# Patient Record
Sex: Male | Born: 1960 | Race: White | Hispanic: No | Smoking: Current every day smoker
Health system: Southern US, Community
[De-identification: ages and names within clinical notes are randomized; demographics above are authoritative.]

## PROBLEM LIST (undated history)

## (undated) DIAGNOSIS — L409 Psoriasis, unspecified: Secondary | ICD-10-CM

## (undated) DIAGNOSIS — M7542 Impingement syndrome of left shoulder: Secondary | ICD-10-CM

## (undated) DIAGNOSIS — F32A Depression, unspecified: Secondary | ICD-10-CM

## (undated) DIAGNOSIS — R109 Unspecified abdominal pain: Secondary | ICD-10-CM

## (undated) DIAGNOSIS — K635 Polyp of colon: Secondary | ICD-10-CM

## (undated) DIAGNOSIS — R079 Chest pain, unspecified: Secondary | ICD-10-CM

## (undated) DIAGNOSIS — M1990 Unspecified osteoarthritis, unspecified site: Secondary | ICD-10-CM

## (undated) DIAGNOSIS — K429 Umbilical hernia without obstruction or gangrene: Secondary | ICD-10-CM

## (undated) DIAGNOSIS — F419 Anxiety disorder, unspecified: Secondary | ICD-10-CM

## (undated) DIAGNOSIS — F329 Major depressive disorder, single episode, unspecified: Secondary | ICD-10-CM

## (undated) HISTORY — PX: KNEE SURGERY: SHX244

## (undated) HISTORY — DX: Polyp of colon: K63.5

## (undated) HISTORY — DX: Chest pain, unspecified: R07.9

## (undated) HISTORY — PX: CHOLECYSTECTOMY: SHX55

## (undated) HISTORY — DX: Unspecified abdominal pain: R10.9

## (undated) HISTORY — PX: ORTHOPEDIC SURGERY: SHX850

## (undated) HISTORY — DX: Psoriasis, unspecified: L40.9

## (undated) HISTORY — DX: Umbilical hernia without obstruction or gangrene: K42.9

## (undated) HISTORY — PX: OPEN ANTERIOR SHOULDER RECONSTRUCTION: SHX2100

---
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
## 1999-02-18 ENCOUNTER — Encounter: Payer: Self-pay | Admitting: Emergency Medicine

## 1999-02-18 ENCOUNTER — Emergency Department (HOSPITAL_COMMUNITY): Admission: EM | Admit: 1999-02-18 | Discharge: 1999-02-18 | Payer: Self-pay | Admitting: Emergency Medicine

## 1999-03-22 ENCOUNTER — Emergency Department (HOSPITAL_COMMUNITY): Admission: EM | Admit: 1999-03-22 | Discharge: 1999-03-22 | Payer: Self-pay | Admitting: Emergency Medicine

## 1999-06-30 ENCOUNTER — Emergency Department (HOSPITAL_COMMUNITY): Admission: EM | Admit: 1999-06-30 | Discharge: 1999-06-30 | Payer: Self-pay | Admitting: Emergency Medicine

## 2000-02-18 ENCOUNTER — Encounter: Payer: Self-pay | Admitting: Emergency Medicine

## 2000-02-18 ENCOUNTER — Emergency Department (HOSPITAL_COMMUNITY): Admission: EM | Admit: 2000-02-18 | Discharge: 2000-02-18 | Payer: Self-pay | Admitting: Emergency Medicine

## 2000-06-19 ENCOUNTER — Emergency Department (HOSPITAL_COMMUNITY): Admission: EM | Admit: 2000-06-19 | Discharge: 2000-06-19 | Payer: Self-pay | Admitting: Emergency Medicine

## 2001-07-14 ENCOUNTER — Emergency Department (HOSPITAL_COMMUNITY): Admission: EM | Admit: 2001-07-14 | Discharge: 2001-07-14 | Payer: Self-pay

## 2001-12-31 ENCOUNTER — Emergency Department (HOSPITAL_COMMUNITY): Admission: EM | Admit: 2001-12-31 | Discharge: 2001-12-31 | Payer: Self-pay | Admitting: Emergency Medicine

## 2002-02-02 ENCOUNTER — Emergency Department (HOSPITAL_COMMUNITY): Admission: EM | Admit: 2002-02-02 | Discharge: 2002-02-02 | Payer: Self-pay | Admitting: Emergency Medicine

## 2002-02-02 ENCOUNTER — Encounter: Payer: Self-pay | Admitting: Emergency Medicine

## 2003-09-16 ENCOUNTER — Emergency Department (HOSPITAL_COMMUNITY): Admission: EM | Admit: 2003-09-16 | Discharge: 2003-09-16 | Payer: Self-pay | Admitting: Emergency Medicine

## 2003-11-19 ENCOUNTER — Emergency Department (HOSPITAL_COMMUNITY): Admission: EM | Admit: 2003-11-19 | Discharge: 2003-11-19 | Payer: Self-pay

## 2005-04-20 ENCOUNTER — Emergency Department (HOSPITAL_COMMUNITY): Admission: EM | Admit: 2005-04-20 | Discharge: 2005-04-20 | Payer: Self-pay | Admitting: Emergency Medicine

## 2005-08-07 ENCOUNTER — Emergency Department (HOSPITAL_COMMUNITY): Admission: EM | Admit: 2005-08-07 | Discharge: 2005-08-07 | Payer: Self-pay | Admitting: Emergency Medicine

## 2005-09-04 ENCOUNTER — Emergency Department (HOSPITAL_COMMUNITY): Admission: EM | Admit: 2005-09-04 | Discharge: 2005-09-04 | Payer: Self-pay | Admitting: *Deleted

## 2005-09-09 ENCOUNTER — Ambulatory Visit (HOSPITAL_COMMUNITY): Admission: RE | Admit: 2005-09-09 | Discharge: 2005-09-09 | Payer: Self-pay | Admitting: Specialist

## 2006-12-26 ENCOUNTER — Emergency Department (HOSPITAL_COMMUNITY): Admission: EM | Admit: 2006-12-26 | Discharge: 2006-12-26 | Payer: Self-pay | Admitting: *Deleted

## 2007-12-14 ENCOUNTER — Emergency Department (HOSPITAL_COMMUNITY): Admission: EM | Admit: 2007-12-14 | Discharge: 2007-12-14 | Payer: Self-pay | Admitting: Emergency Medicine

## 2008-01-30 ENCOUNTER — Encounter (INDEPENDENT_AMBULATORY_CARE_PROVIDER_SITE_OTHER): Payer: Self-pay | Admitting: General Surgery

## 2008-01-30 ENCOUNTER — Ambulatory Visit (HOSPITAL_COMMUNITY): Admission: RE | Admit: 2008-01-30 | Discharge: 2008-01-30 | Payer: Self-pay | Admitting: General Surgery

## 2008-05-01 ENCOUNTER — Emergency Department (HOSPITAL_COMMUNITY): Admission: EM | Admit: 2008-05-01 | Discharge: 2008-05-01 | Payer: Self-pay | Admitting: Emergency Medicine

## 2008-05-30 ENCOUNTER — Emergency Department (HOSPITAL_COMMUNITY): Admission: EM | Admit: 2008-05-30 | Discharge: 2008-05-30 | Payer: Self-pay | Admitting: Emergency Medicine

## 2008-10-06 ENCOUNTER — Emergency Department (HOSPITAL_COMMUNITY): Admission: EM | Admit: 2008-10-06 | Discharge: 2008-10-06 | Payer: Self-pay | Admitting: Emergency Medicine

## 2009-09-11 ENCOUNTER — Encounter: Admission: RE | Admit: 2009-09-11 | Discharge: 2009-09-11 | Payer: Self-pay | Admitting: Cardiology

## 2009-11-30 ENCOUNTER — Ambulatory Visit (HOSPITAL_BASED_OUTPATIENT_CLINIC_OR_DEPARTMENT_OTHER): Admission: RE | Admit: 2009-11-30 | Discharge: 2009-11-30 | Payer: Self-pay | Admitting: Specialist

## 2010-01-21 ENCOUNTER — Emergency Department (HOSPITAL_COMMUNITY): Admission: EM | Admit: 2010-01-21 | Discharge: 2010-01-21 | Payer: Self-pay | Admitting: Emergency Medicine

## 2010-01-26 ENCOUNTER — Emergency Department (HOSPITAL_COMMUNITY): Admission: EM | Admit: 2010-01-26 | Discharge: 2010-01-26 | Payer: Self-pay | Admitting: Emergency Medicine

## 2010-07-02 LAB — POCT HEMOGLOBIN-HEMACUE: Hemoglobin: 16.7 g/dL (ref 13.0–17.0)

## 2010-08-31 NOTE — Op Note (Signed)
NAME:  Tyler Clark, Tyler Clark                ACCOUNT NO.:  [id number]   MEDICAL RECORD NO.:  [id number]          PATIENT TYPE:  AMB   LOCATION:  DAY                          FACILITY:  [facility]   PHYSICIAN:  Almond Lint, MD       DATE OF BIRTH:  07/19/1960   DATE OF PROCEDURE:  01/30/2008  DATE OF DISCHARGE:                               OPERATIVE REPORT   PREOPERATIVE DIAGNOSES:  Symptomatic cholelithiasis, umbilical hernia.   POSTOPERATIVE DIAGNOSES:  Symptomatic cholelithiasis, umbilical hernia.   PROCEDURES PERFORMED:  Laparoscopic cholecystectomy with intraoperative  cholangiogram and umbilical hernia repair.   SURGEON:  Almond Lint, M.D.   ASSISTANT:  Ollen Gross. Vernell Morgans, M.D.   ANESTHESIA:  General and local.   FINDINGS:  Small gallbladder, full of stones.  Cholangiogram  demonstrating no filling defect.  Good filling of the right and left  hepatic ducts and positive contrast all the way to the duodenum.   SPECIMENS:  Gallbladder to pathology.   EBL:  Minimal.   COMPLICATIONS:  None known.   DESCRIPTION OF PROCEDURE:  Mr. Ritson was identified in the holding area  and taken to the operating room, where he was placed supine on the  operating room table.  General endotracheal anesthesia was induced.  Time-out was performed, according to the surgical safety check-list.  The abdomen was prepped and draped in sterile fashion.  The  supraumbilical skin was infiltrated with local anesthesia and a vertical  incision was made, just above the umbilicus.  The umbilical stalk was  grasped with a Kocher and elevated.  The hernia defect was seen and the  Kocher placed on either side of the fascia and a Kelly clamp used to  enter the peritoneum.  The hernia defect was very tiny, on the order of  8-10 mm.  A purse-string Vicryl suture was placed on the fascial  incision and the Hasson trocar advanced into the abdomen.  The  pneumoperitoneum was achieved to a pressure of 15 mmHg.  The camera  was  advanced into the abdomen.  The patient was placed in reverse  Trendelenburg position, rotated to the left.  A 10 mm port was placed in  the epigastric region under direct visualization after infiltration of  local anesthesia.  Two 5 mm ports were placed in the right upper  quadrant in similar fashion.   The [place] dissector was used to strip the peritoneum from the  infundibulum of the gallbladder to obtain a view of the cystic duct.  This was skeletonized and seen to be directly entering the gallbladder.  An arterial structure was right next to it.  This also was skeletonized  and seen to be directly entering the gallbladder.  A clip was placed on  the gallbladder aspect of the cystic duct and a ductotomy made with the  Endoshears.  A cholangiogram catheter was advanced through the abdominal  wall and the catheter placed through the ductotomy into the cystic duct.  Cholangiogram was shot after placing the patient flat and releasing the  pneumoperitoneum.  This revealed the findings above.  The clip  was then  removed following re-insufflation of the abdomen and the cholangiogram  catheter removed.  The artery was clipped twice on the patient side,  once on the specimen side and the cystic duct was clipped three times on  the patient side.  The Endoshears were used to transect the cystic duct  and the artery.  The hook cautery was then used to take the gallbladder  off the gallbladder fossa.  The gallbladder was placed into the  EndoCatch bag and removed through the umbilicus without difficulty.  The  abdomen was re-insufflated and the liver bed inspected for hemostasis.  There were a few spots of very slow oozing, which were coagulated with  the Bovie electrocautery.  The liver bed was then irrigated, as well as  the area over the liver and this area returned clear.  The area was re-  inspected and there was no bleeding seen.   The right upper quadrant and epigastric ports were  removed under direct  visualization with no bleeding seen.  The Hasson trocar was then removed  and the hernia defect closed with the 0 vicryl purse-string suture.  The  defect was again palpated and there was good closure.  The skin was  closed with 4-0 Monocryl and then cleaned, dried and dressed with  Dermabond.  The patient was awakened from anesthesia and taken to the  PACU in good condition.  Needle and sponge counts were correct times  two.      Almond Lint, MD  Electronically Signed     FB/MEDQ  D:  01/30/2008  T:  01/30/2008  Job:  [id number]

## 2011-01-18 LAB — HEMOGLOBIN AND HEMATOCRIT, BLOOD
HCT: 52.9 — ABNORMAL HIGH
Hemoglobin: 17.8 — ABNORMAL HIGH

## 2011-09-02 ENCOUNTER — Encounter (HOSPITAL_COMMUNITY): Payer: Self-pay | Admitting: Emergency Medicine

## 2011-09-02 ENCOUNTER — Emergency Department (HOSPITAL_COMMUNITY): Payer: BC Managed Care – PPO

## 2011-09-02 ENCOUNTER — Emergency Department (HOSPITAL_COMMUNITY)
Admission: EM | Admit: 2011-09-02 | Discharge: 2011-09-02 | Disposition: A | Discharge status: Home / Self Care | Payer: BC Managed Care – PPO | Attending: Emergency Medicine | Admitting: Emergency Medicine

## 2011-09-02 DIAGNOSIS — R609 Edema, unspecified: Secondary | ICD-10-CM | POA: Insufficient documentation

## 2011-09-02 DIAGNOSIS — S61509A Unspecified open wound of unspecified wrist, initial encounter: Secondary | ICD-10-CM | POA: Insufficient documentation

## 2011-09-02 DIAGNOSIS — S60811A Abrasion of right wrist, initial encounter: Secondary | ICD-10-CM

## 2011-09-02 DIAGNOSIS — M25439 Effusion, unspecified wrist: Secondary | ICD-10-CM | POA: Insufficient documentation

## 2011-09-02 DIAGNOSIS — S60211A Contusion of right wrist, initial encounter: Secondary | ICD-10-CM

## 2011-09-02 DIAGNOSIS — IMO0002 Reserved for concepts with insufficient information to code with codable children: Secondary | ICD-10-CM | POA: Insufficient documentation

## 2011-09-02 DIAGNOSIS — M25539 Pain in unspecified wrist: Secondary | ICD-10-CM | POA: Insufficient documentation

## 2011-09-02 DIAGNOSIS — S60219A Contusion of unspecified wrist, initial encounter: Secondary | ICD-10-CM | POA: Insufficient documentation

## 2011-09-02 MED ORDER — IBUPROFEN 600 MG PO TABS: 600.0000 mg | ORAL_TABLET | Freq: Four times a day (QID) | ORAL | Status: AC | PRN

## 2011-09-02 MED ORDER — TETANUS-DIPHTH-ACELL PERTUSSIS 5-2.5-18.5 LF-MCG/0.5 IM SUSP
0.5000 mL | Freq: Once | INTRAMUSCULAR | Status: AC
  Administered 2011-09-02: 0.5 mL via INTRAMUSCULAR
  Filled 2011-09-02: qty 0.5

## 2011-09-02 NOTE — Discharge Instructions (Signed)
Your x-ray is negative. Keep cut clean. Apply topical antibiotic ointment twice a day. Ice wrist several times a day. Apply splint for comfort. Follow up with your doctor in 2-3 days if not improving.   Contusion A contusion is a deep bruise. Contusions are the result of an injury that caused bleeding under the skin. The contusion may turn blue, purple, or yellow. Minor injuries will give you a painless contusion, but more severe contusions may stay painful and swollen for a few weeks.  CAUSES  A contusion is usually caused by a blow, trauma, or direct force to an area of the body. SYMPTOMS   Swelling and redness of the injured area.   Bruising of the injured area.   Tenderness and soreness of the injured area.   Pain.  DIAGNOSIS  The diagnosis can be made by taking a history and physical exam. An X-ray, CT scan, or MRI may be needed to determine if there were any associated injuries, such as fractures. TREATMENT  Specific treatment will depend on what area of the body was injured. In general, the best treatment for a contusion is resting, icing, elevating, and applying cold compresses to the injured area. Over-the-counter medicines may also be recommended for pain control. Ask your caregiver what the best treatment is for your contusion. HOME CARE INSTRUCTIONS   Put ice on the injured area.   Put ice in a plastic bag.   Place a towel between your skin and the bag.   Leave the ice on for 15 to 20 minutes, 3 to 4 times a day.   Only take over-the-counter or prescription medicines for pain, discomfort, or fever as directed by your caregiver. Your caregiver may recommend avoiding anti-inflammatory medicines (aspirin, ibuprofen, and naproxen) for 48 hours because these medicines may increase bruising.   Rest the injured area.   If possible, elevate the injured area to reduce swelling.  SEEK IMMEDIATE MEDICAL CARE IF:   You have increased bruising or swelling.   You have pain that is  getting worse.   Your swelling or pain is not relieved with medicines.  MAKE SURE YOU:   Understand these instructions.   Will watch your condition.   Will get help right away if you are not doing well or get worse.  Document Released: 01/12/2005 Document Revised: 03/24/2011 Document Reviewed: 02/07/2011 [facility] Patient Information 2012 [place], [place].

## 2011-09-02 NOTE — ED Provider Notes (Signed)
History     CSN: [id number]  Arrival date & time 09/02/11  1436   First MD Initiated Contact with Patient 09/02/11 1550      Chief Complaint  Patient presents with  . Extremity Laceration    (Consider location/radiation/quality/duration/timing/severity/associated sxs/prior treatment) Patient is a 51 y.o. male presenting with wrist pain. The history is provided by the patient.  Wrist Pain This is a new problem. The current episode started today. Associated symptoms include joint swelling. Pertinent negatives include no chills, fever, numbness or weakness.  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 States the cord that you pull on snapped off and hit him in right wrist. States pain and laceration to right wrist. Pain with movement of the wrist, swelling. No other injuries. Bleeding stopped. Tetanus not up to date.   No past medical history on file.  No past surgical history on file.  No family history on file.  History  Substance Use Topics  . Smoking status: Current Everyday Smoker -- 1.0 packs/day  . Smokeless tobacco: Not on file  . Alcohol Use: Yes     Beer      Review of Systems  Constitutional: Negative for fever and chills.  Respiratory: Negative.   Cardiovascular: Negative.   Musculoskeletal: Positive for joint swelling.  Skin: Positive for wound.  Neurological: Negative for weakness and numbness.    Allergies  Review of patient's allergies indicates no known allergies.  Home Medications  No current outpatient prescriptions on file.  BP 115/74  Pulse 73  Temp(Src) 98.8 F (37.1 C) (Oral)  Resp 20  SpO2 100%  Physical Exam  Nursing note and vitals reviewed. Constitutional: He is oriented to person, place, and time. He appears well-developed and well-nourished. No distress.  HENT:  Head: Normocephalic.  Eyes: Conjunctivae are normal.  Pulmonary/Chest: Effort normal and breath sounds normal. No respiratory distress. He has no wheezes. He has no rales.   Musculoskeletal: He exhibits edema and tenderness.       Swelling over radial aspect of right wrist. Pain with flexion, extension of the wrist. Tender over distal radius. There is a triangular abrasion to that area. Hemostatic.   Neurological: He is alert and oriented to person, place, and time.  Skin: Skin is warm and dry.  Psychiatric: He has a normal mood and affect.    ED Course  Procedures (including critical care time)  Pt with right wrist pain and swelling, as well as a small abrasion. Abrasion cleaned with saline and safe clense, bacitracin and band aid applied. No suturing required. Will get x-ray.  Dg Wrist Complete Right  09/02/2011  *RADIOLOGY REPORT*  Clinical Data: Laceration at wrist, pain  RIGHT WRIST - COMPLETE 3+ VIEW  Comparison: Right hand radiographs 08/07/2005  Findings: Bone mineralization normal. Joint spaces preserved. No fracture, dislocation, or bone destruction. No definite radiopaque foreign body or soft tissue gas identified. Soft tissue swelling at radial aspect of wrist.  IMPRESSION: No acute osseous findings.  Original Report Authenticated By: Lollie Marrow, M.D.   5:05 PM X-ray negative. Pt has significant pain with movement of the wrist. No snuff box tenderness. Will splint with a velcro splint and follow up.    1. Contusion of right wrist   2. Abrasion of right wrist       MDM          Lottie Mussel, PA 09/03/11 [phone]

## 2011-09-02 NOTE — ED Notes (Signed)
Working with lawnmower today. It jumped back and struck pt in right wrist. V- shaped laceration noted. Bleeding controlled

## 2011-09-05 NOTE — ED Provider Notes (Signed)
Medical screening examination/treatment/procedure(s) were performed by non-physician practitioner and as supervising physician I was immediately available for consultation/collaboration.   Suzi Roots, MD 09/05/11 [phone]

## 2011-10-13 ENCOUNTER — Emergency Department (HOSPITAL_COMMUNITY)
Admission: EM | Admit: 2011-10-13 | Discharge: 2011-10-13 | Disposition: A | Discharge status: Home / Self Care | Payer: BC Managed Care – PPO | Attending: Emergency Medicine | Admitting: Emergency Medicine

## 2011-10-13 ENCOUNTER — Encounter (HOSPITAL_COMMUNITY): Payer: Self-pay | Admitting: Emergency Medicine

## 2011-10-13 DIAGNOSIS — L237 Allergic contact dermatitis due to plants, except food: Secondary | ICD-10-CM

## 2011-10-13 DIAGNOSIS — L255 Unspecified contact dermatitis due to plants, except food: Secondary | ICD-10-CM | POA: Insufficient documentation

## 2011-10-13 DIAGNOSIS — F17200 Nicotine dependence, unspecified, uncomplicated: Secondary | ICD-10-CM | POA: Insufficient documentation

## 2011-10-13 MED ORDER — METHYLPREDNISOLONE SODIUM SUCC 125 MG IJ SOLR
125.0000 mg | Freq: Once | INTRAMUSCULAR | Status: AC
  Administered 2011-10-13: 125 mg via INTRAMUSCULAR
  Filled 2011-10-13: qty 2

## 2011-10-13 NOTE — Discharge Instructions (Signed)
Poison Ivy Poison ivy is a inflammation of the skin (contact dermatitis) caused by touching the allergens on the leaves of the ivy plant following previous exposure to the plant. The rash usually appears 48 hours after exposure. The rash is usually bumps (papules) or blisters (vesicles) in a linear pattern. Depending on your own sensitivity, the rash may simply cause redness and itching, or it may also progress to blisters which may break open. These must be well cared for to prevent secondary bacterial (germ) infection, followed by scarring. Keep any open areas dry, clean, dressed, and covered with an antibacterial ointment if needed. The eyes may also get puffy. The puffiness is worst in the morning and gets better as the day progresses. This dermatitis usually heals without scarring, within 2 to 3 weeks without treatment. HOME CARE INSTRUCTIONS  Thoroughly wash with soap and water as soon as you have been exposed to poison ivy. You have about one half hour to remove the plant resin before it will cause the rash. This washing will destroy the oil or antigen on the skin that is causing, or will cause, the rash. Be sure to wash under your fingernails as any plant resin there will continue to spread the rash. Do not rub skin vigorously when washing affected area. Poison ivy cannot spread if no oil from the plant remains on your body. A rash that has progressed to weeping sores will not spread the rash unless you have not washed thoroughly. It is also important to wash any clothes you have been wearing as these may carry active allergens. The rash will return if you wear the unwashed clothing, even several days later. Avoidance of the plant in the future is the best measure. Poison ivy plant can be recognized by the number of leaves. Generally, poison ivy has three leaves with flowering branches on a single stem. Diphenhydramine may be purchased over the counter and used as needed for itching. Do not drive with  this medication if it makes you drowsy.Ask your caregiver about medication for children. SEEK MEDICAL CARE IF:  Open sores develop.   Redness spreads beyond area of rash.   You notice purulent (pus-like) discharge.   You have increased pain.   Other signs of infection develop (such as fever).  Document Released: 04/01/2000 Document Revised: 03/24/2011 Document Reviewed: 02/18/2009 [employer] Patient Information 2012 [employer]. 

## 2011-10-13 NOTE — ED Notes (Signed)
Pt presents c/o "poison oak" that started x 2 days ago on right ring finger

## 2011-10-13 NOTE — ED Provider Notes (Signed)
History     CSN: [id number]  Arrival date & time 10/13/11  1449   First MD Initiated Contact with Patient 10/13/11 1702      Chief Complaint  Patient presents with  . Rash    (Consider location/radiation/quality/duration/timing/severity/associated sxs/prior treatment) HPI  Pt to the ER with complaints of rash to right hand. He informs me he gets poison oak rash on him twice a year and this looks and feels like the same. He admits the rash started two days ago and "is really small and not very bad". Rash is itchy and mildly tender. 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 He denies N, V, D, fevers chills or weakness. NAD and VSS.   History reviewed. No pertinent past medical history.  History reviewed. No pertinent past surgical history.  No family history on file.  History  Substance Use Topics  . Smoking status: Current Everyday Smoker -- 1.0 packs/day  . Smokeless tobacco: Not on file  . Alcohol Use: Yes     Beer      Review of Systems   HEENT: denies blurry vision or change in hearing PULMONARY: Denies difficulty breathing and SOB CARDIAC: denies chest pain or heart palpitations MUSCULOSKELETAL:  denies being unable to ambulate ABDOMEN AL: denies abdominal pain GU: denies loss of bowel or urinary control NEURO: denies numbness and tingling in extremities SKIN: new rash to right hand     Allergies  Review of patient's allergies indicates no known allergies.  Home Medications  No current outpatient prescriptions on file.  BP 117/72  Pulse 78  Temp 98.2 F (36.8 C) (Oral)  Resp 18  Ht 5\' 8"  (1.727 m)  Wt 180 lb (81.647 kg)  BMI 27.37 kg/m2  SpO2 100%  Physical Exam  Nursing note and vitals reviewed. Constitutional: He appears well-developed and well-nourished. No distress.  HENT:  Head: Normocephalic and atraumatic.  Eyes: Pupils are equal, round, and reactive to light.  Neck: Normal range of motion. Neck supple.  Cardiovascular: Normal rate and  regular rhythm.   Pulmonary/Chest: Effort normal.  Abdominal: Soft.  Neurological: He is alert.  Skin: Skin is warm and dry. Rash noted. Rash is vesicular.       Small 0.5 cm x 1 cm vesicular rash to 4th finger on right hand.    ED Course  Procedures (including critical care time)  Labs Reviewed - No data to display No results found.   1. Allergic dermatitis due to poison oak       MDM  Pt given IM solumedrol 125 in ED.   Pt has been advised of the symptoms that warrant their return to the ED. Patient has voiced understanding and has agreed to follow-up with the PCP or specialist.         Dorthula Matas, PA 10/13/11 1719

## 2011-10-13 NOTE — ED Notes (Signed)
Pt states exposed to poison oak 2 days ago-right hand between last 2 digits

## 2011-10-14 NOTE — ED Provider Notes (Signed)
Medical screening examination/treatment/procedure(s) were performed by non-physician practitioner and as supervising physician I was immediately available for consultation/collaboration.    Romulus Hanrahan L Tiago Humphrey, MD 10/14/11 0007 

## 2012-07-08 ENCOUNTER — Encounter (HOSPITAL_COMMUNITY): Payer: Self-pay | Admitting: Emergency Medicine

## 2012-07-08 ENCOUNTER — Emergency Department (HOSPITAL_COMMUNITY)
Admission: EM | Admit: 2012-07-08 | Discharge: 2012-07-08 | Disposition: A | Discharge status: Home / Self Care | Payer: 59 | Attending: Emergency Medicine | Admitting: Emergency Medicine

## 2012-07-08 ENCOUNTER — Emergency Department (HOSPITAL_COMMUNITY): Payer: 59

## 2012-07-08 DIAGNOSIS — F17200 Nicotine dependence, unspecified, uncomplicated: Secondary | ICD-10-CM | POA: Insufficient documentation

## 2012-07-08 DIAGNOSIS — Y929 Unspecified place or not applicable: Secondary | ICD-10-CM | POA: Insufficient documentation

## 2012-07-08 DIAGNOSIS — Y9389 Activity, other specified: Secondary | ICD-10-CM | POA: Insufficient documentation

## 2012-07-08 DIAGNOSIS — S6390XA Sprain of unspecified part of unspecified wrist and hand, initial encounter: Secondary | ICD-10-CM | POA: Insufficient documentation

## 2012-07-08 DIAGNOSIS — X500XXA Overexertion from strenuous movement or load, initial encounter: Secondary | ICD-10-CM | POA: Insufficient documentation

## 2012-07-08 MED ORDER — IBUPROFEN 800 MG PO TABS: 800.0000 mg | ORAL_TABLET | Freq: Three times a day (TID) | ORAL | Status: DC

## 2012-07-08 NOTE — ED Provider Notes (Signed)
History    This chart was scribed for non-physician practitioner working with Richardean Canal, MD by Donne Anon, ED Scribe. This patient was seen in room WTR8/WTR8 and the patient's care was started at 1903.   CSN: [id number]  Arrival date & time 07/08/12  1845   First MD Initiated Contact with Patient 07/08/12 1903      Chief Complaint  Patient presents with  . Hand Pain    The history is provided by the patient. No language interpreter was used.   Tyler Clark is a 52 y.o. male who presents to the Emergency Department complaining of gradual onset, constant, gradually worsening moderate right hand pain which began 1 week ago. Pt states he was twisting an oil filter when he began to feel a popping and grinding sensation. He reports associated swelling and bruising. Pt denies any other pain.    History reviewed. No pertinent past medical history.  Past Surgical History  Procedure Laterality Date  . Orthopedic surgery    . Cholecystectomy      No family history on file.  History  Substance Use Topics  . Smoking status: Current Every Day Smoker -- 1.00 packs/day    Types: Cigarettes  . Smokeless tobacco: Not on file  . Alcohol Use: 1.2 oz/week    2 Cans of beer per week     Comment: every other day      Review of Systems  Constitutional: Negative for fever.  Gastrointestinal: Negative for vomiting.  Musculoskeletal: Positive for joint swelling and arthralgias.  All other systems reviewed and are negative.    Allergies  Review of patient's allergies indicates no known allergies.  Home Medications   Current Outpatient Rx  Name  Route  Sig  Dispense  Refill  . ibuprofen (ADVIL,MOTRIN) 800 MG tablet   Oral   Take 1 tablet (800 mg total) by mouth 3 (three) times daily.   21 tablet   0     BP 136/81  Pulse 79  Temp(Src) 98.2 F (36.8 C) (Oral)  Resp 18  SpO2 100%  Physical Exam  Nursing note and vitals reviewed. Constitutional: He is oriented to person,  place, and time. He appears well-developed and well-nourished. No distress.  HENT:  Head: Normocephalic and atraumatic.  Eyes: EOM are normal.  Neck: Neck supple. No tracheal deviation present.  Cardiovascular: Normal rate.   Pulmonary/Chest: Effort normal. No respiratory distress.  Musculoskeletal: Normal range of motion.  Swelling in the fourth and third metacarpal of right hand. Sensation in tact. Mild edema. No induration or crepitance. No obvious deformities or rashes. Mild tenderness to palpation.  Neurological: He is alert and oriented to person, place, and time.  Skin: Skin is warm and dry.  Psychiatric: He has a normal mood and affect. His behavior is normal.    ED Course  Procedures (including critical care time) DIAGNOSTIC STUDIES: Oxygen Saturation is 100% on room air, normal by my interpretation.    COORDINATION OF CARE: 7:55 PM Discussed treatment plan which includes Advil 3x/day and rest when possible with pt at bedside and pt agreed to plan. Will refer to hand specialist.     Labs Reviewed - No data to display Dg Hand Complete Right  07/08/2012  *RADIOLOGY REPORT*  Clinical Data: Right hand pain and swelling.  No known injury.  RIGHT HAND - COMPLETE 3+ VIEW  Comparison: None.  Findings: The joint spaces are maintained.  No acute bony findings. No arthropathic changes.  IMPRESSION: No acute  bony findings or arthropathic changes.   Original Report Authenticated By: Rudie Meyer, M.D.      1. Hand sprain and strain, right, initial encounter       MDM  Pt has been advised of the symptoms that warrant their return to the ED. Patient has voiced understanding and has agreed to follow-up with the PCP or specialist.  I personally performed the services described in this documentation, which was scribed in my presence. The recorded information has been reviewed and is accurate.        Dorthula Matas, PA-C 07/09/12 [phone]

## 2012-07-08 NOTE — ED Notes (Signed)
Pt c/o right hand pain and swelling x 1 week. No known injury.

## 2012-07-11 NOTE — ED Provider Notes (Signed)
Medical screening examination/treatment/procedure(s) were performed by non-physician practitioner and as supervising physician I was immediately available for consultation/collaboration.   David H Yao, MD 07/11/12 1542 

## 2012-08-03 ENCOUNTER — Encounter (HOSPITAL_COMMUNITY): Payer: Self-pay | Admitting: Emergency Medicine

## 2012-08-03 ENCOUNTER — Emergency Department (HOSPITAL_COMMUNITY): Payer: 59

## 2012-08-03 ENCOUNTER — Emergency Department (HOSPITAL_COMMUNITY)
Admission: EM | Admit: 2012-08-03 | Discharge: 2012-08-03 | Disposition: A | Discharge status: Home / Self Care | Payer: 59 | Attending: Emergency Medicine | Admitting: Emergency Medicine

## 2012-08-03 DIAGNOSIS — Z9089 Acquired absence of other organs: Secondary | ICD-10-CM | POA: Insufficient documentation

## 2012-08-03 DIAGNOSIS — R110 Nausea: Secondary | ICD-10-CM | POA: Insufficient documentation

## 2012-08-03 DIAGNOSIS — R109 Unspecified abdominal pain: Secondary | ICD-10-CM | POA: Insufficient documentation

## 2012-08-03 DIAGNOSIS — R197 Diarrhea, unspecified: Secondary | ICD-10-CM | POA: Insufficient documentation

## 2012-08-03 DIAGNOSIS — F17200 Nicotine dependence, unspecified, uncomplicated: Secondary | ICD-10-CM | POA: Insufficient documentation

## 2012-08-03 LAB — CBC WITH DIFFERENTIAL/PLATELET
Eosinophils Relative: 4 % (ref 0–5)
Lymphocytes Relative: 12 % (ref 12–46)
Lymphs Abs: 1.2 10*3/uL (ref 0.7–4.0)
MCH: 30.8 pg (ref 26.0–34.0)
Monocytes Absolute: 0.6 10*3/uL (ref 0.1–1.0)
Monocytes Relative: 6 % (ref 3–12)
Neutro Abs: 8 10*3/uL — ABNORMAL HIGH (ref 1.7–7.7)
RBC: 4.91 MIL/uL (ref 4.22–5.81)
WBC: 10.2 10*3/uL (ref 4.0–10.5)

## 2012-08-03 LAB — URINALYSIS, ROUTINE W REFLEX MICROSCOPIC
Glucose, UA: 100 mg/dL — AB
Protein, ur: NEGATIVE mg/dL
Urobilinogen, UA: 0.2 mg/dL (ref 0.0–1.0)
pH: 6 (ref 5.0–8.0)

## 2012-08-03 LAB — COMPREHENSIVE METABOLIC PANEL
BUN: 12 mg/dL (ref 6–23)
Calcium: 9.2 mg/dL (ref 8.4–10.5)
Creatinine, Ser: 0.94 mg/dL (ref 0.50–1.35)
Glucose, Bld: 136 mg/dL — ABNORMAL HIGH (ref 70–99)
Total Bilirubin: 0.3 mg/dL (ref 0.3–1.2)
Total Protein: 6.8 g/dL (ref 6.0–8.3)

## 2012-08-03 LAB — LIPASE, BLOOD: Lipase: 37 U/L (ref 11–59)

## 2012-08-03 MED ORDER — OXYCODONE-ACETAMINOPHEN 5-325 MG PO TABS: 2.0000 | ORAL_TABLET | ORAL | Status: DC | PRN

## 2012-08-03 MED ORDER — CIPROFLOXACIN HCL 500 MG PO TABS: 500.0000 mg | ORAL_TABLET | Freq: Two times a day (BID) | ORAL | Status: DC

## 2012-08-03 MED ORDER — SODIUM CHLORIDE 0.9 % IV SOLN
INTRAVENOUS | Status: DC
  Administered 2012-08-03: 12:00:00 via INTRAVENOUS

## 2012-08-03 MED ORDER — IOHEXOL 300 MG/ML  SOLN
100.0000 mL | Freq: Once | INTRAMUSCULAR | Status: AC | PRN
  Administered 2012-08-03: 100 mL via INTRAVENOUS

## 2012-08-03 MED ORDER — HYDROMORPHONE HCL PF 1 MG/ML IJ SOLN
1.0000 mg | Freq: Once | INTRAMUSCULAR | Status: AC
  Administered 2012-08-03: 1 mg via INTRAVENOUS
  Filled 2012-08-03: qty 1

## 2012-08-03 MED ORDER — METRONIDAZOLE 500 MG PO TABS: 500.0000 mg | ORAL_TABLET | Freq: Two times a day (BID) | ORAL | Status: DC

## 2012-08-03 MED ORDER — ONDANSETRON HCL 4 MG/2ML IJ SOLN
4.0000 mg | Freq: Once | INTRAMUSCULAR | Status: AC
  Administered 2012-08-03: 4 mg via INTRAVENOUS
  Filled 2012-08-03: qty 2

## 2012-08-03 MED ORDER — IOHEXOL 300 MG/ML  SOLN
50.0000 mL | Freq: Once | INTRAMUSCULAR | Status: AC | PRN
  Administered 2012-08-03: 50 mL via INTRAVENOUS

## 2012-08-03 NOTE — ED Notes (Signed)
Pt aware of the need for a urine sample. Urinal at bedside. 

## 2012-08-03 NOTE — ED Notes (Signed)
Pt back from CT

## 2012-08-03 NOTE — ED Provider Notes (Addendum)
History     CSN: [id number]  Arrival date & time 08/03/12  1113   First MD Initiated Contact with Patient 08/03/12 1123      Chief Complaint  Patient presents with  . Abdominal Pain    (Consider location/radiation/quality/duration/timing/severity/associated sxs/prior treatment) Patient is a 52 y.o. male presenting with abdominal pain. The history is provided by the patient.  Abdominal Pain Pain location:  RUQ and RLQ Pain quality: burning   Pain radiates to:  Does not radiate Pain severity:  Moderate Onset quality:  Gradual Duration:  4 days Timing:  Constant Progression:  Worsening Chronicity:  New Context: alcohol use and eating   Relieved by:  Nothing Worsened by:  Nothing tried Ineffective treatments:  None tried Associated symptoms: diarrhea and nausea   Associated symptoms: no constipation, no dysuria, no fever, no melena and no vomiting    Pt got worse with movement today, also notes diarrhea--h/o chole History reviewed. No pertinent past medical history.  Past Surgical History  Procedure Laterality Date  . Orthopedic surgery    . Cholecystectomy      No family history on file.  History  Substance Use Topics  . Smoking status: Current Every Day Smoker -- 1.00 packs/day    Types: Cigarettes  . Smokeless tobacco: Not on file  . Alcohol Use: 1.2 oz/week    2 Cans of beer per week     Comment: every other day      Review of Systems  Constitutional: Negative for fever.  Gastrointestinal: Positive for nausea, abdominal pain and diarrhea. Negative for vomiting, constipation and melena.  Genitourinary: Negative for dysuria.  All other systems reviewed and are negative.    Allergies  Review of patient's allergies indicates no known allergies.  Home Medications   Current Outpatient Rx  Name  Route  Sig  Dispense  Refill  . ibuprofen (ADVIL,MOTRIN) 800 MG tablet   Oral   Take 1 tablet (800 mg total) by mouth 3 (three) times daily.   21 tablet    0     BP 112/81  Pulse 75  Temp(Src) 98.1 F (36.7 C) (Oral)  SpO2 97%  Physical Exam  Nursing note and vitals reviewed. Constitutional: He is oriented to person, place, and time. He appears well-developed and well-nourished.  Non-toxic appearance. No distress.  HENT:  Head: Normocephalic and atraumatic.  Eyes: Conjunctivae, EOM and lids are normal. Pupils are equal, round, and reactive to light.  Neck: Normal range of motion. Neck supple. No tracheal deviation present. No mass present.  Cardiovascular: Normal rate, regular rhythm and normal heart sounds.  Exam reveals no gallop.   No murmur heard. Pulmonary/Chest: Effort normal and breath sounds normal. No stridor. No respiratory distress. He has no decreased breath sounds. He has no wheezes. He has no rhonchi. He has no rales.  Abdominal: Soft. Normal appearance and bowel sounds are normal. He exhibits no distension. There is tenderness in the right upper quadrant and right lower quadrant. There is no rigidity, no rebound, no guarding and no CVA tenderness.  Musculoskeletal: Normal range of motion. He exhibits no edema and no tenderness.  Neurological: He is alert and oriented to person, place, and time. He has normal strength. No cranial nerve deficit or sensory deficit. GCS eye subscore is 4. GCS verbal subscore is 5. GCS motor subscore is 6.  Skin: Skin is warm and dry. No abrasion and no rash noted.  Psychiatric: He has a normal mood and affect. His speech is normal  and behavior is normal.    ED Course  Procedures (including critical care time)  Labs Reviewed  CBC WITH DIFFERENTIAL  COMPREHENSIVE METABOLIC PANEL  LIPASE, BLOOD  URINALYSIS, ROUTINE W REFLEX MICROSCOPIC   No results found.   No diagnosis found.    MDM  Pt given pain meds and feels better, abd ct results noted, will tx with cipro/flagyl and give GI referral--repeat abd exam remains non-surgical at time of d/c        Toy Baker, MD 08/03/12  1330  Toy Baker, MD 08/03/12 [phone]

## 2012-08-03 NOTE — ED Notes (Signed)
Pt complains of abdominal pain x 4 days. 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

## 2012-08-03 NOTE — ED Notes (Signed)
Patient transported to CT 

## 2012-08-06 ENCOUNTER — Encounter: Payer: Self-pay | Admitting: Gastroenterology

## 2012-08-16 HISTORY — PX: COLONOSCOPY W/ POLYPECTOMY: SHX1380

## 2012-08-20 DIAGNOSIS — K635 Polyp of colon: Secondary | ICD-10-CM

## 2012-08-20 HISTORY — DX: Polyp of colon: K63.5

## 2012-08-22 ENCOUNTER — Encounter: Payer: Self-pay | Admitting: Gastroenterology

## 2012-08-22 ENCOUNTER — Ambulatory Visit (INDEPENDENT_AMBULATORY_CARE_PROVIDER_SITE_OTHER): Payer: 59 | Admitting: Gastroenterology

## 2012-08-22 VITALS — BP 110/70 | HR 72 | Ht 66.14 in | Wt 175.1 lb

## 2012-08-22 DIAGNOSIS — R197 Diarrhea, unspecified: Secondary | ICD-10-CM

## 2012-08-22 DIAGNOSIS — R109 Unspecified abdominal pain: Secondary | ICD-10-CM

## 2012-08-22 DIAGNOSIS — K921 Melena: Secondary | ICD-10-CM

## 2012-08-22 DIAGNOSIS — R933 Abnormal findings on diagnostic imaging of other parts of digestive tract: Secondary | ICD-10-CM

## 2012-08-22 MED ORDER — CIPROFLOXACIN HCL 500 MG PO TABS: 500.0000 mg | ORAL_TABLET | Freq: Two times a day (BID) | ORAL | Status: DC

## 2012-08-22 MED ORDER — PEG-KCL-NACL-NASULF-NA ASC-C 100 G PO SOLR: 1.0000 | Freq: Once | ORAL | Status: DC

## 2012-08-22 MED ORDER — METRONIDAZOLE 500 MG PO TABS: 500.0000 mg | ORAL_TABLET | Freq: Two times a day (BID) | ORAL | Status: DC

## 2012-08-22 NOTE — Patient Instructions (Addendum)
You have been scheduled for a colonoscopy with propofol. Please follow written instructions given to you at your visit today.  Please pick up your prep kit at the pharmacy within the next 1-3 days. If you use inhalers (even only as needed), please bring them with you on the day of your procedure. Your physician has requested that you go to www.startemmi.com and enter the access code given to you at your visit today. This web site gives a general overview about your procedure. However, you should still follow specific instructions given to you by our office regarding your preparation for the procedure.  We have sent the following medications to your pharmacy for you to pick up at your convenience: Cipro and Flagyl to take one tablet by mouth twice daily for 10 days.  Thank you for choosing me and  Gastroenterology.  Venita Lick. Pleas Koch., MD., Clementeen Graham

## 2012-08-22 NOTE — Progress Notes (Signed)
History of Present Illness: This is a 52 year old male who relates a long history of urgent stools following meals since his cholecystectomy. 3 weeks ago he had the sudden onset of urgent watery nonbloody diarrhea with epigastric pain and lower abdominal pain. He presented to the emergency department and enteritis was noted in his distal small bowel on CT scan. As prescribed a course of Cipro and Flagyl and the symptoms temporarily diminished but have returned. They're not quite as severe as they were initially. He notes small amounts of bright red blood on the tissue paper when wiping. He attributes this to his worsening diarrhea. Denies weight loss, abdominal pain, constipation, diarrhea, change in stool caliber, melena, hematochezia, nausea, vomiting, dysphagia, reflux symptoms, chest pain.  Review of Systems: Pertinent positive and negative review of systems were noted in the above HPI section. All other review of systems were otherwise negative.  Current Medications, Allergies, Past Medical History, Past Surgical History, Family History and Social History were reviewed in [employer] record.  Physical Exam: General: Well developed , well nourished, no acute distress Head: Normocephalic and atraumatic Eyes:  sclerae anicteric, EOMI Ears: Normal auditory acuity Mouth: No deformity or lesions Neck: Supple, no masses or thyromegaly Lungs: Clear throughout to auscultation Heart: Regular rate and rhythm; no murmurs, rubs or bruits Abdomen: Soft, mild to moderate lower abdominal and epigastric tenderness to deep palpation and non distended. No masses, hepatosplenomegaly or hernias noted. Normal Bowel sounds Rectal: Small external hemorrhoidal tags no internal lesions trace Hemoccult-positive brown stool in the rectal vault Musculoskeletal: Symmetrical with no gross deformities  Skin: Erythematous and silver plaques below both knees  Pulses:  Normal pulses noted Extremities:  No clubbing, cyanosis, edema or deformities noted Neurological: Alert oriented x 4, grossly nonfocal Cervical Nodes:  No significant cervical adenopathy Inguinal Nodes: No significant inguinal adenopathy Psychological:  Alert and cooperative. Normal mood and affect  Assessment and Recommendations:  1. Acute onset of diarrhea associated with epigastric pain and lower abdominal pain and abnormal thickening of the small bowel on CT. Possible persistent enteritis. Rule out Crohn's disease, colorectal neoplasms and other disorders. Plan for a 10 day course of Cipro and Flagyl. Schedule colonoscopy. The risks, benefits, and alternatives to colonoscopy with possible biopsy and possible polypectomy were discussed with the patient and they consent to proceed.

## 2012-08-23 ENCOUNTER — Telehealth: Payer: Self-pay | Admitting: Gastroenterology

## 2012-08-23 NOTE — Telephone Encounter (Signed)
Left a message stating patient can come by and pick up a 10 dollar voucher from our office if he wants but this is the Colonoscopy prep that Dr. Russella Dar prefers but told him to call back to let me know if he wants the voucher.

## 2012-08-23 NOTE — Telephone Encounter (Signed)
Patient returned my call and would like voucher. Told patient I can mail it to him and pt agreed that would be best. Voucher mailed to patient.

## 2012-09-11 ENCOUNTER — Encounter: Payer: Self-pay | Admitting: Gastroenterology

## 2012-09-11 ENCOUNTER — Ambulatory Visit (AMBULATORY_SURGERY_CENTER): Payer: BC Managed Care – PPO | Admitting: Gastroenterology

## 2012-09-11 VITALS — BP 133/83 | HR 63 | Temp 96.7°F | Resp 13 | Ht 66.0 in | Wt 175.0 lb

## 2012-09-11 DIAGNOSIS — K921 Melena: Secondary | ICD-10-CM

## 2012-09-11 DIAGNOSIS — D126 Benign neoplasm of colon, unspecified: Secondary | ICD-10-CM

## 2012-09-11 DIAGNOSIS — R109 Unspecified abdominal pain: Secondary | ICD-10-CM

## 2012-09-11 DIAGNOSIS — R933 Abnormal findings on diagnostic imaging of other parts of digestive tract: Secondary | ICD-10-CM

## 2012-09-11 DIAGNOSIS — R197 Diarrhea, unspecified: Secondary | ICD-10-CM

## 2012-09-11 MED ORDER — SODIUM CHLORIDE 0.9 % IV SOLN: 500.0000 mL | INTRAVENOUS | Status: DC

## 2012-09-11 NOTE — Progress Notes (Signed)
Lidocaine-40mg IV prior to Propofol InductionPropofol given over incremental dosages 

## 2012-09-11 NOTE — Op Note (Signed)
[facility] [address] [place], [address]   COLONOSCOPY PROCEDURE REPORT  PATIENT: Tyler, Clark  MR#: [id number] BIRTHDATE: 1960-06-01 , 52  yrs. old GENDER: Male ENDOSCOPIST: Meryl Dare, MD, [facility] PROCEDURE DATE:  09/11/2012 PROCEDURE:   Colonoscopy with snare polypectomy ASA CLASS:   Class II INDICATIONS:hematochezia, heme-positive stool, an abnormal CT, and unexplained diarrhea. MEDICATIONS: MAC sedation, administered by CRNA and propofol (Diprivan) 400mg  IV DESCRIPTION OF PROCEDURE:   After the risks benefits and alternatives of the procedure were thoroughly explained, informed consent was obtained.  A digital rectal exam revealed no abnormalities of the rectum.   The LB [id number]  endoscope was introduced through the anus and advanced to the cecum, which was identified by both the appendix and ileocecal valve. No adverse events experienced.   The quality of the prep was good, using MoviPrep  The instrument was then slowly withdrawn as the colon was fully examined.  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  No bleeding was noted. Two sessile polyps measuring 6-7 mm in size were found in the transverse colon.  A polypectomy was performed with a cold snare.  The resection was complete and the polyp tissue was completely retrieved. Two sessile polyps measuring 5-6 mm in size were found in the descending colon.  A polypectomy was performed with a cold snare.  The resection was complete and the polyp tissue was completely retrieved.  Two sessile polyps measuring 6-7 mm in size were found in the sigmoid colon.  A polypectomy was performed with a cold snare.  The resection was complete and the polyp tissue was completely retrieved. Two pedunculated polyps measuring 12-15 mm in size were found in the sigmoid colon.  A polypectomy was performed using snare cautery.  The resection was complete and the polyp tissue was completely  retrieved.   Two sessile polyps measuring 6-7 mm in size were found in the rectum.  A polypectomy was performed with a cold snare.  The resection was complete and the polyp tissue was completely retrieved. The colon was otherwise normal. There was no diverticulosis, inflammation, polyps or cancers unless previously stated.  The mucosa appeared normal in the terminal ileum.  Retroflexed views revealed small internal hemorrhoids. The time to cecum=1 minutes 42 seconds.  Withdrawal time=17 minutes 06 seconds.  The scope was withdrawn and the procedure completed.  COMPLICATIONS: There were no complications.   ENDOSCOPIC IMPRESSION: 1.   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   Two sessile polyps measuring 6-7 mm in the transverse colon; polypectomy performed with a cold snare 3.   Two sessile polyps measuring 5-6 mm in the descending colon; polypectomy performed with a cold snare 4.   Two sessile polyps measuring 6-7 mm in the sigmoid colon; polypectomy performed with a cold snare 5.   Two pedunculated polyps measuring 12-15 mm in the sigmoid colon; polypectomy performed using snare cautery 6.   Two sessile polyps measuring 6-7 mm in the rectum; polypectomy performed with a cold snare 7.   Small internal hemorrhoids 8.   Normal mucosa in the terminal ileum  RECOMMENDATIONS: 1.  Hold aspirin, aspirin products, and anti-inflammatory medication for 2 weeks. 2.  Await pathology results 3.  Repeat colonoscopy in 1-3 years pending pathology review   eSigned:  Meryl Dare, MD, [facility] 09/11/2012 3:57 PM      PATIENT NAME:  Tyler, Clark MR#: [id number]

## 2012-09-11 NOTE — Patient Instructions (Addendum)

## 2012-09-11 NOTE — Progress Notes (Signed)
No egg or soy allergy. ewm 

## 2012-09-11 NOTE — Progress Notes (Signed)
Patient did not experience any of the following events: a burn prior to discharge; a fall within the facility; wrong site/side/patient/procedure/implant event; or a hospital transfer or hospital admission upon discharge from the facility. (G8907) Patient did not have preoperative order for IV antibiotic SSI prophylaxis. (G8918)  

## 2012-09-11 NOTE — Progress Notes (Signed)
Called to room to assist during endoscopic procedure.  Patient ID and intended procedure confirmed with present staff. Received instructions for my participation in the procedure from the performing physician.  

## 2012-09-12 ENCOUNTER — Telehealth: Payer: Self-pay | Admitting: *Deleted

## 2012-09-12 NOTE — Telephone Encounter (Signed)
  Follow up Call-  Call back number 09/11/2012  Post procedure Call Back phone  # [phone]  Permission to leave phone message Yes     Patient questions:  Do you have a fever, pain , or abdominal swelling? no Pain Score  0 *  Have you tolerated food without any problems? yes  Have you been able to return to your normal activities? yes  Do you have any questions about your discharge instructions: Diet   no Medications  no Follow up visit  no  Do you have questions or concerns about your Care? no  Actions: * If pain score is 4 or above: No action needed, pain <4.

## 2012-09-17 ENCOUNTER — Encounter: Payer: Self-pay | Admitting: Gastroenterology

## 2012-11-04 ENCOUNTER — Emergency Department (HOSPITAL_COMMUNITY)
Admission: EM | Admit: 2012-11-04 | Discharge: 2012-11-04 | Disposition: A | Discharge status: Home / Self Care | Payer: BC Managed Care – PPO | Attending: Emergency Medicine | Admitting: Emergency Medicine

## 2012-11-04 ENCOUNTER — Encounter (HOSPITAL_COMMUNITY): Payer: Self-pay | Admitting: *Deleted

## 2012-11-04 DIAGNOSIS — Z8719 Personal history of other diseases of the digestive system: Secondary | ICD-10-CM | POA: Insufficient documentation

## 2012-11-04 DIAGNOSIS — L299 Pruritus, unspecified: Secondary | ICD-10-CM | POA: Insufficient documentation

## 2012-11-04 DIAGNOSIS — F17200 Nicotine dependence, unspecified, uncomplicated: Secondary | ICD-10-CM | POA: Insufficient documentation

## 2012-11-04 DIAGNOSIS — L259 Unspecified contact dermatitis, unspecified cause: Secondary | ICD-10-CM | POA: Insufficient documentation

## 2012-11-04 MED ORDER — TRIAMCINOLONE ACETONIDE 0.1 % EX CREA: TOPICAL_CREAM | Freq: Two times a day (BID) | CUTANEOUS | Status: DC

## 2012-11-04 MED ORDER — DEXAMETHASONE SODIUM PHOSPHATE 4 MG/ML IJ SOLN
4.0000 mg | Freq: Once | INTRAMUSCULAR | Status: AC
  Administered 2012-11-04: 4 mg via INTRAMUSCULAR
  Filled 2012-11-04: qty 1

## 2012-11-04 NOTE — ED Notes (Signed)
Pt states he has "poison Oak on his arms, wrist, hand"

## 2012-11-04 NOTE — ED Provider Notes (Signed)
History    CSN: [id number] Arrival date & time 11/04/12  2030  First MD Initiated Contact with Patient 11/04/12 2045     Chief Complaint  Patient presents with  . Rash   (Consider location/radiation/quality/duration/timing/severity/associated sxs/prior Treatment) HPI Tyler Clark is a 52 y.o. male who presents to ED with complaint of a rash to his arms. States rash is red, itchy, only over wrists and arms. States feels like last time he had "poison oak." Pt states he has not tried any treatment for it. 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 States noticed rash about 2 days ago, feels like it is spreading. Denies any new products, soaps, detergents. No one in family with similar rash. States has poison ivy/oak breakouts multiple times a year, stats "shot works for me." Denies fever, chills, malaise.    Past Medical History  Diagnosis Date  . Umbilical hernia   . Abdominal pain   . Chest pain    Past Surgical History  Procedure Laterality Date  . Orthopedic surgery    . Cholecystectomy     Family History  Problem Relation Age of Onset  . Diabetes Father   . Colon cancer Neg Hx   . Rectal cancer Neg Hx   . Stomach cancer Neg Hx    History  Substance Use Topics  . Smoking status: Current Every Day Smoker -- 1.00 packs/day    Types: Cigarettes  . Smokeless tobacco: Never Used  . Alcohol Use: 1.2 oz/week    2 Cans of beer per week     Comment: every other day    Review of Systems  Constitutional: Negative for fever and chills.  HENT: Negative for neck pain and neck stiffness.   Respiratory: Negative.   Cardiovascular: Negative.   Genitourinary: Negative for dysuria and flank pain.  Musculoskeletal: Negative.   Skin: Positive for rash.  Neurological: Negative for weakness, numbness and headaches.    Allergies  Review of patient's allergies indicates no known allergies.  Home Medications  No current outpatient prescriptions on file. BP 129/77  Pulse 66  Temp(Src)  98.6 F (37 C) (Oral)  Resp 18  Ht 5\' 7"  (1.702 m)  Wt 168 lb (76.204 kg)  BMI 26.31 kg/m2  SpO2 98% Physical Exam  Nursing note and vitals reviewed. Constitutional: He appears well-developed and well-nourished. No distress.  HENT:  Head: Normocephalic.  Right Ear: External ear normal.  Left Ear: External ear normal.  Nose: Nose normal.  Mouth/Throat: Oropharynx is clear and moist.  No lesions on lips or oral mucosa  Eyes: Conjunctivae are normal.  Neck: Neck supple.  Cardiovascular: Normal rate, regular rhythm and normal heart sounds.   Pulmonary/Chest: Effort normal and breath sounds normal. No respiratory distress. He has no wheezes. He has no rales.  Skin:  Several patches of erythematous papules in linear patterns over bilateral forearms. No rash anywhere else.     ED Course  Procedures (including critical care time) Labs Reviewed - No data to display No results found.   1. Contact dermatitis     MDM  Pt with rash that most resembles contact dermatitis. Hx of the same. Pt requesting decadron shot. Will give 4mg  IM. Home with kenalog cream BID. Follow up with PCP. Pt otherwise non toxic appearing, no distress, no systemic symptoms.   Filed Vitals:   11/04/12 2041  BP: 129/77  Pulse: 66  Temp: 98.6 F (37 C)  TempSrc: Oral  Resp: 18  Height: 5\' 7"  (1.702 m)  Weight: 168 lb (76.204 kg)  SpO2: 98%     Lottie Mussel, PA-C 11/05/12 4540

## 2012-11-05 MED ORDER — KETOROLAC TROMETHAMINE 15 MG/ML IJ SOLN
INTRAMUSCULAR | Status: AC
  Filled 2012-11-05: qty 1

## 2012-11-05 MED ORDER — POTASSIUM CHLORIDE IN NACL 20-0.9 MEQ/L-% IV SOLN
INTRAVENOUS | Status: AC
  Filled 2012-11-05: qty 1000

## 2012-11-08 NOTE — ED Provider Notes (Signed)
Medical screening examination/treatment/procedure(s) were performed by non-physician practitioner and as supervising physician I was immediately available for consultation/collaboration.  Myron Lona, MD 11/08/12 2324 

## 2013-03-10 ENCOUNTER — Ambulatory Visit (INDEPENDENT_AMBULATORY_CARE_PROVIDER_SITE_OTHER): Payer: BC Managed Care – PPO | Admitting: Family Medicine

## 2013-03-10 VITALS — BP 132/72 | HR 70 | Temp 98.5°F | Resp 18 | Ht 67.0 in | Wt 183.2 lb

## 2013-03-10 DIAGNOSIS — L738 Other specified follicular disorders: Secondary | ICD-10-CM

## 2013-03-10 DIAGNOSIS — L409 Psoriasis, unspecified: Secondary | ICD-10-CM

## 2013-03-10 DIAGNOSIS — L259 Unspecified contact dermatitis, unspecified cause: Secondary | ICD-10-CM

## 2013-03-10 DIAGNOSIS — L408 Other psoriasis: Secondary | ICD-10-CM

## 2013-03-10 DIAGNOSIS — L739 Follicular disorder, unspecified: Secondary | ICD-10-CM

## 2013-03-10 MED ORDER — DOXYCYCLINE HYCLATE 100 MG PO CAPS: 100.0000 mg | ORAL_CAPSULE | Freq: Two times a day (BID) | ORAL | Status: DC

## 2013-03-10 MED ORDER — TRIAMCINOLONE 0.1 % CREAM:EUCERIN CREAM 1:1: 1.0000 "application " | TOPICAL_CREAM | Freq: Two times a day (BID) | CUTANEOUS | Status: DC

## 2013-03-10 NOTE — Progress Notes (Signed)
Subjective:  This chart was scribed for Tyler Simmer, MD by Carl Best, Medical Scribe. This patient was seen in Room 8 and the patient's care was started at 2:07 PM.   Patient ID: Tyler Clark, male    DOB: [birth year], 52 y.o.   MRN: [id number]  HPI HPI Comments: Tyler Clark is a 52 y.o. male with a history of psoriasis who presents to the [facility] complaining of a non-itching rash located bilaterally on his arms that started a couple of days ago after he started using a new brand of soap at work.  He states that the rash has caused his skin to dry on his arms and swelling bilaterally on his hands.  He states that the rash feels like a burning sensation and causes the skin on his wrists to be tender.  He states that he put lotion on his arms bilaterally but does not remember what brand of lotion he used.  He denies changing his shampoo, detergents, washing powder, and conditioner.  The patient states that he does not use Tide when washing his clothes.  The patient also has a small pustular lesion on his right cheek that appears and reappears every year.  The patient has a history of colonoscopy to remove polyps.  He states that his last colonoscopy was this year.  He states that he obtained one because he was noticing hematochezia.  The patient denies getting a flu shot.  The patient denies having any allergies.  The patient denies having a PCP.  The patient is a lube-tech.      The patient states that he uses lotion and a steroid cream that he obtained from a friend with psoriasis to treat his psoriasis symptoms.      Review of Systems  Skin: Positive for color change (erythema), rash (bilateral arms) and wound (lesion on right cheek).  All other systems reviewed and are negative.     Objective:  Physical Exam  Nursing note and vitals reviewed. Constitutional: He is oriented to person, place, and time. He appears well-developed and well-nourished. No distress.   HENT:  Head: Normocephalic and atraumatic.  Eyes: Conjunctivae and EOM are normal. Pupils are equal, round, and reactive to light.  Neck: Normal range of motion. Neck supple.  Cardiovascular: Normal rate, regular rhythm and normal heart sounds.   Pulmonary/Chest: Effort normal and breath sounds normal. No respiratory distress.  Musculoskeletal: Normal range of motion. He exhibits no edema.  Neurological: He is alert and oriented to person, place, and time.  Skin: Skin is warm and dry. Abrasion and rash noted. Laceration: scattered and superficial without drainage or erythema. There is erythema.  Scaling, flaky, erythematous skin bilateral forearms and extends into the hands bilaterally. Psoriatic placks on bilateral on the elbow. Right cheek there is a pustular lesion with well-healing eschar with induration.   Psychiatric: He has a normal mood and affect. His behavior is normal.     Assessment & Plan:   1. Contact dermatitis   2. Psoriasis   3. Folliculitis    1 Contact dermatitis:  New.  Rx for Triamcinolone-Eucerin cream provided to use on arms, elbows.  Recommend Zyrtec or Claritin 10mg  one tablet daily for next two weeks. 2.  Psoriasis: stable; can use Triamcinolone cream to psoriasis rash as well. 3.  Folliculitis:  New. Rx for Doxycycline provided for facial pustular lesion.   Meds ordered this encounter  Medications  . DISCONTD: Triamcinolone Acetonide (TRIAMCINOLONE  0.1 % CREAM : EUCERIN) CREA    Sig: Apply 1 application topically 2 (two) times daily.    Dispense:  1 each    Refill:  0  . DISCONTD: doxycycline (VIBRAMYCIN) 100 MG capsule    Sig: Take 1 capsule (100 mg total) by mouth 2 (two) times daily.    Dispense:  20 capsule    Refill:  0   I personally performed the services described in this documentation, which was scribed in my presence.  The recorded information has been reviewed and is accurate.  Tyler Clark, M.D.  [facility] [address] [place], [place]  [address] [phone] phone [phone] fax

## 2013-03-10 NOTE — Patient Instructions (Addendum)
1.  RECOMMEND CLARITIN/LORATADINE 10MG  ONE TABLET DAILY.

## 2013-05-04 ENCOUNTER — Emergency Department (HOSPITAL_COMMUNITY): Payer: BC Managed Care – PPO

## 2013-05-04 ENCOUNTER — Emergency Department (HOSPITAL_COMMUNITY)
Admission: EM | Admit: 2013-05-04 | Discharge: 2013-05-04 | Discharge status: Against Medical Advice | Payer: BC Managed Care – PPO | Attending: Emergency Medicine | Admitting: Emergency Medicine

## 2013-05-04 ENCOUNTER — Encounter (HOSPITAL_COMMUNITY): Payer: Self-pay | Admitting: Emergency Medicine

## 2013-05-04 DIAGNOSIS — F17200 Nicotine dependence, unspecified, uncomplicated: Secondary | ICD-10-CM | POA: Insufficient documentation

## 2013-05-04 DIAGNOSIS — Z792 Long term (current) use of antibiotics: Secondary | ICD-10-CM | POA: Insufficient documentation

## 2013-05-04 DIAGNOSIS — Z720 Tobacco use: Secondary | ICD-10-CM

## 2013-05-04 DIAGNOSIS — Z872 Personal history of diseases of the skin and subcutaneous tissue: Secondary | ICD-10-CM | POA: Insufficient documentation

## 2013-05-04 DIAGNOSIS — R079 Chest pain, unspecified: Secondary | ICD-10-CM | POA: Insufficient documentation

## 2013-05-04 DIAGNOSIS — Z860100 Personal history of colon polyps, unspecified: Secondary | ICD-10-CM | POA: Insufficient documentation

## 2013-05-04 DIAGNOSIS — IMO0002 Reserved for concepts with insufficient information to code with codable children: Secondary | ICD-10-CM | POA: Insufficient documentation

## 2013-05-04 DIAGNOSIS — J42 Unspecified chronic bronchitis: Secondary | ICD-10-CM | POA: Insufficient documentation

## 2013-05-04 LAB — BASIC METABOLIC PANEL
BUN: 10 mg/dL (ref 6–23)
CHLORIDE: 99 meq/L (ref 96–112)
CO2: 21 mEq/L (ref 19–32)
Calcium: 8.9 mg/dL (ref 8.4–10.5)
Creatinine, Ser: 0.92 mg/dL (ref 0.50–1.35)
GFR calc Af Amer: >90 mL/min (ref >90)
GLUCOSE: 97 mg/dL (ref 70–99)
POTASSIUM: 3.7 meq/L (ref 3.7–5.3)
Sodium: 138 mEq/L (ref 137–147)

## 2013-05-04 LAB — CBC WITH DIFFERENTIAL/PLATELET
Basophils Absolute: 0 10*3/uL (ref 0.0–0.1)
Basophils Relative: 1 % (ref 0–1)
EOS ABS: 0.1 10*3/uL (ref 0.0–0.7)
Eosinophils Relative: 2 % (ref 0–5)
HEMATOCRIT: 45.3 % (ref 39.0–52.0)
HEMOGLOBIN: 15.6 g/dL (ref 13.0–17.0)
LYMPHS ABS: 1.6 10*3/uL (ref 0.7–4.0)
Lymphocytes Relative: 29 % (ref 12–46)
MCH: 31.1 pg (ref 26.0–34.0)
MCHC: 34.4 g/dL (ref 30.0–36.0)
MCV: 90.4 fL (ref 78.0–100.0)
MONO ABS: 0.5 10*3/uL (ref 0.1–1.0)
MONOS PCT: 9 % (ref 3–12)
NEUTROS PCT: 60 % (ref 43–77)
Neutro Abs: 3.3 10*3/uL (ref 1.7–7.7)
Platelets: 198 10*3/uL (ref 150–400)
RBC: 5.01 MIL/uL (ref 4.22–5.81)
RDW: 13.4 % (ref 11.5–15.5)
WBC: 5.5 10*3/uL (ref 4.0–10.5)

## 2013-05-04 LAB — POCT I-STAT TROPONIN I: Troponin i, poc: 0 ng/mL (ref 0.00–0.08)

## 2013-05-04 MED ORDER — NITROGLYCERIN 2 % TD OINT
1.0000 [in_us] | TOPICAL_OINTMENT | Freq: Once | TRANSDERMAL | Status: AC
  Administered 2013-05-04: 1 [in_us] via TOPICAL
  Filled 2013-05-04: qty 1

## 2013-05-04 MED ORDER — ASPIRIN 81 MG PO CHEW
324.0000 mg | CHEWABLE_TABLET | Freq: Once | ORAL | Status: AC
  Administered 2013-05-04: 81 mg via ORAL
  Filled 2013-05-04: qty 4

## 2013-05-04 NOTE — ED Notes (Signed)
Woke up, started coughing, c/o pain to left chest area.  Pain in left chest all the time but worse when he coughs.  Productive phlegm, some blood tinge noted a couple days ago.

## 2013-05-04 NOTE — ED Notes (Signed)
Pt does not want to be admitted and per Dr Lemar Livings, he will sign out AMA.

## 2013-05-04 NOTE — ED Provider Notes (Signed)
CSN: [id number]     Arrival date & time 05/04/13  7371 History   First MD Initiated Contact with Patient 05/04/13 [phone]     Chief Complaint  Patient presents with  . Cough   (Consider location/radiation/quality/duration/timing/severity/associated sxs/prior Treatment) HPI Patient is a 53 yo man with an estimated 80 pack year history who smokes 1ppd. He presents with complaints of chest pain associated with a productive cough. Pain is centrally located and extends to the left lower chest. Pain is worse with coughing. Patient denies fever and says that his cough feels like his typical smoker's cough. He has some intermittent DOE but notes this is chronic.   He denies history of similar sx. Denies history of CAD or any previous cardiac evaluation. Patient does not receive regular medical care. Says he is unaware of any positive FH with regards to CAD.   Patient describes discomfort as "like a pressure on me". 6/10 prior to arrival. Currently 3-4/10. No history of similar sx. No associated diaphoresis.   Past Medical History  Diagnosis Date  . Umbilical hernia   . Abdominal pain   . Chest pain   . Colon polyps 08/20/2012    colonoscopy for bloody stools.  . Psoriasis    Past Surgical History  Procedure Laterality Date  . Orthopedic surgery    . Cholecystectomy    . Colonoscopy w/ polypectomy  08/16/2012    +colon polyps; repeat in 5 years.   Family History  Problem Relation Age of Onset  . Diabetes Father   . Colon cancer Neg Hx   . Rectal cancer Neg Hx   . Stomach cancer Neg Hx    History  Substance Use Topics  . Smoking status: Current Every Day Smoker -- 1.00 packs/day    Types: Cigarettes  . Smokeless tobacco: Never Used  . Alcohol Use: 1.2 oz/week    2 Cans of beer per week     Comment: every other day    Review of Systems Ten point review of symptoms performed and is negative with the exception of symptoms noted above.  Allergies  Review of patient's allergies indicates  no known allergies.  Home Medications   Current Outpatient Rx  Name  Route  Sig  Dispense  Refill  . doxycycline (VIBRAMYCIN) 100 MG capsule   Oral   Take 1 capsule (100 mg total) by mouth 2 (two) times daily.   20 capsule   0   . Triamcinolone Acetonide (TRIAMCINOLONE 0.1 % CREAM : EUCERIN) CREA   Topical   Apply 1 application topically 2 (two) times daily.   1 each   0   . triamcinolone cream (KENALOG) 0.1 %   Topical   Apply topically 2 (two) times daily.   30 g   0    BP 107/67  Temp(Src) 98.4 F (36.9 C) (Oral)  Resp 20  Ht 5\' 6"  (1.676 m)  Wt 180 lb (81.647 kg)  BMI 29.07 kg/m2  SpO2 97% Physical Exam Gen: well developed and well nourished appearing Head: NCAT Eyes: PERL, EOMI Nose: no epistaixis or rhinorrhea Mouth/throat: mucosa is moist and pink Neck: supple, no stridor Lungs: CTA B, no wheezing, rhonchi or rales CV: RRR, no murmur, extremities appear well perfused.  Abd: soft, notender, nondistended Back: no ttp, no cva ttp Skin: heavily tattood Ext: normal to inspection, no dependent edema Neuro: CN ii-xii grossly intact, no focal deficits Psyche; normal affect,  calm and cooperative.   ED Course  Procedures (including critical  care time) Labs Review  Results for orders placed during the hospital encounter of 05/04/13 (from the past 24 hour(s))  CBC WITH DIFFERENTIAL     Status: None   Collection Time    05/04/13  5:55 AM      Result Value Range   WBC 5.5  4.0 - 10.5 K/uL   RBC 5.01  4.22 - 5.81 MIL/uL   Hemoglobin 15.6  13.0 - 17.0 g/dL   HCT 45.3  39.0 - 52.0 %   MCV 90.4  78.0 - 100.0 fL   MCH 31.1  26.0 - 34.0 pg   MCHC 34.4  30.0 - 36.0 g/dL   RDW 13.4  11.5 - 15.5 %   Platelets 198  150 - 400 K/uL   Neutrophils Relative % 60  43 - 77 %   Neutro Abs 3.3  1.7 - 7.7 K/uL   Lymphocytes Relative 29  12 - 46 %   Lymphs Abs 1.6  0.7 - 4.0 K/uL   Monocytes Relative 9  3 - 12 %   Monocytes Absolute 0.5  0.1 - 1.0 K/uL   Eosinophils  Relative 2  0 - 5 %   Eosinophils Absolute 0.1  0.0 - 0.7 K/uL   Basophils Relative 1  0 - 1 %   Basophils Absolute 0.0  0.0 - 0.1 K/uL  BASIC METABOLIC PANEL     Status: None   Collection Time    05/04/13  5:55 AM      Result Value Range   Sodium 138  137 - 147 mEq/L   Potassium 3.7  3.7 - 5.3 mEq/L   Chloride 99  96 - 112 mEq/L   CO2 21  19 - 32 mEq/L   Glucose, Bld 97  70 - 99 mg/dL   BUN 10  6 - 23 mg/dL   Creatinine, Ser 0.92  0.50 - 1.35 mg/dL   Calcium 8.9  8.4 - 10.5 mg/dL   GFR calc non Af Amer >90  >90 mL/min   GFR calc Af Amer >90  >90 mL/min  POCT I-STAT TROPONIN I     Status: None   Collection Time    05/04/13  6:00 AM      Result Value Range   Troponin i, poc 0.00  0.00 - 0.08 ng/mL   Comment 3            DG Chest 2 View (Final result)  Result time: 05/04/13 06:42:16    Final result by Rad Results In Interface (05/04/13 06:42:16)    Narrative:   CLINICAL DATA: Cough, sternal and epigastric pain  EXAM: CHEST 2 VIEW  COMPARISON: Prior radiograph from 09/11/2009  FINDINGS: The cardiac and mediastinal silhouettes are stable in size and contour, and remain within normal limits.  The lungs are normally inflated. No airspace consolidation, pleural effusion, or pulmonary edema is identified. There is no pneumothorax.  No acute osseous abnormality identified. Cholecystectomy clips noted in the right upper quadrant.  IMPRESSION: No active cardiopulmonary disease.   Electronically Signed By: Jeannine Boga M.D. On: 05/04/2013 06:42   EKG: NSR, rate 85 bpm, normal qrs, normal intervals normal axis, normal ST T segments.     MDM   Emergency department workup is nondiagnostic progress of the patient chest pain. However, in light of his heavy smoking history and unknown family history and lack of any previous primary medical care or, I believe he is high risk for coronary artery disease and hypertension did that he be  admitted for serial troponins and  functional study.  The patient says that he refuses to be admitted because he cannot smoke in the hospital. I have offered him a nicotine patch as an alternative. He adamantly declines admission. I have counseled him on the potential consequences, including death, a failure to diagnose and appropriately treat coronary artery disease. He is able to comprehend this and is competent to decline medical advice. He will sign out AMA. I will refer him to the [facility]  for outpatient followup. The patient is asked to return to the emergency department should he experience recurrent chest pain.   Elyn Peers, MD 05/04/13 [phone]

## 2013-08-02 ENCOUNTER — Encounter (HOSPITAL_COMMUNITY): Payer: Self-pay | Admitting: Emergency Medicine

## 2013-08-02 ENCOUNTER — Emergency Department (HOSPITAL_COMMUNITY)
Admission: EM | Admit: 2013-08-02 | Discharge: 2013-08-03 | Disposition: A | Discharge status: Home / Self Care | Payer: BC Managed Care – PPO | Attending: Emergency Medicine | Admitting: Emergency Medicine

## 2013-08-02 DIAGNOSIS — Z860100 Personal history of colon polyps, unspecified: Secondary | ICD-10-CM | POA: Insufficient documentation

## 2013-08-02 DIAGNOSIS — Z872 Personal history of diseases of the skin and subcutaneous tissue: Secondary | ICD-10-CM | POA: Insufficient documentation

## 2013-08-02 DIAGNOSIS — H16139 Photokeratitis, unspecified eye: Secondary | ICD-10-CM | POA: Insufficient documentation

## 2013-08-02 DIAGNOSIS — F17200 Nicotine dependence, unspecified, uncomplicated: Secondary | ICD-10-CM | POA: Insufficient documentation

## 2013-08-02 DIAGNOSIS — J3489 Other specified disorders of nose and nasal sinuses: Secondary | ICD-10-CM | POA: Insufficient documentation

## 2013-08-02 DIAGNOSIS — H16133 Photokeratitis, bilateral: Secondary | ICD-10-CM

## 2013-08-02 MED ORDER — HYDROMORPHONE HCL PF 2 MG/ML IJ SOLN
2.0000 mg | Freq: Once | INTRAMUSCULAR | Status: AC
  Administered 2013-08-02: 2 mg via INTRAMUSCULAR
  Filled 2013-08-02: qty 1

## 2013-08-02 MED ORDER — PROPARACAINE HCL 0.5 % OP SOLN
1.0000 [drp] | Freq: Once | OPHTHALMIC | Status: AC
  Administered 2013-08-02: 1 [drp] via OPHTHALMIC
  Filled 2013-08-02: qty 15

## 2013-08-02 MED ORDER — OXYCODONE-ACETAMINOPHEN 5-325 MG PO TABS: 1.0000 | ORAL_TABLET | Freq: Four times a day (QID) | ORAL | Status: DC | PRN

## 2013-08-02 MED ORDER — ERYTHROMYCIN 5 MG/GM OP OINT
TOPICAL_OINTMENT | Freq: Once | OPHTHALMIC | Status: AC
  Administered 2013-08-02: 23:00:00 via OPHTHALMIC
  Filled 2013-08-02: qty 3.5

## 2013-08-02 MED ORDER — ONDANSETRON 8 MG PO TBDP
8.0000 mg | ORAL_TABLET | Freq: Once | ORAL | Status: DC
  Filled 2013-08-02: qty 1

## 2013-08-02 MED ORDER — OXYCODONE-ACETAMINOPHEN 5-325 MG PO TABS
2.0000 | ORAL_TABLET | Freq: Once | ORAL | Status: AC
  Administered 2013-08-03: 2 via ORAL
  Filled 2013-08-02: qty 2

## 2013-08-02 NOTE — ED Notes (Signed)
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 He states he was able to see afterward and only had some burning. Around 6pm the burning began to increase. He tried a home remedy by placing a potatoe (sliced) on his eyes. He has burned his eyes in the past and his home remedy usually helps but did not this time.

## 2013-08-02 NOTE — ED Notes (Signed)
Patient unable to open his eyes for assessment. He denies loss of vision, but that everything appears cloudy.

## 2013-08-02 NOTE — Discharge Instructions (Signed)
Please read and follow all provided instructions.  Your diagnoses today include:  1. Photokeratitis of both eyes    Tests performed today include:  Visual acuity testing to check your vision  Fluorescein dye examination to look for scratches on your eye  Tonometry to check the pressure inside of your eye  Vital signs. See below for your results today.   Medications prescribed:   Percocet (oxycodone/acetaminophen) - narcotic pain medication  DO NOT drive or perform any activities that require you to be awake and alert because this medicine can make you drowsy. BE VERY CAREFUL not to take multiple medicines containing Tylenol (also called acetaminophen). Doing so can lead to an overdose which can damage your liver and cause liver failure and possibly death.   Erythromycin  - antibiotic eye ointment  Use this medication as follows:  Apply 1/4" of the antibiotic ointment to affected eye up to 6 times a day while awake for 7 days  Take any prescribed medications only as directed.  Home care instructions:  Follow any educational materials contained in this packet. If you wear contact lenses, do not use them until your eye caregiver approves. Follow-up care is necessary to be sure the infection is healing if not completely resolved in 2-3 days. See your caregiver or eye specialist as suggested for followup.   If you have an eye infection, wash your hands often as this is very contagious and is easily spread from person to person.   Follow-up instructions: Please follow-up with your primary care doctor OR the opthalmologist listed in the next 2-3 days for further evaluation of your symptoms.  If you do not have a primary care doctor -- see below for referral information.   Return instructions:   Please return to the Emergency Department if you experience worsening symptoms.   Please return immediately if you develop severe pain, pus drainage, new change in vision, or  fever.  Please return if you have any other emergent concerns.  Additional Information:  Your vital signs today were: BP 147/83   Pulse 77   Temp(Src) 98 F (36.7 C) (Oral)   Resp 22   SpO2 94% If your blood pressure (BP) was elevated above 135/85 this visit, please have this repeated by your doctor within one month. ---------------

## 2013-08-02 NOTE — ED Provider Notes (Signed)
CSN: [id number]     Arrival date & time 08/02/13  2223 History   First MD Initiated Contact with Patient 08/02/13 2243     Chief Complaint  Patient presents with  . Burn     (Consider location/radiation/quality/duration/timing/severity/associated sxs/prior Treatment) HPI Comments: Patient presents with complaint of bilateral eye pain that began at approximately 6 PM today. 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 He did not get any sparks or debris into his eyes. Patient began having burning which became more intense with time. Patient placed a sliced potato over his eye but this did not help. Patient currently complaining of severe pain and tearing of the eyes. No fever, nausea, vomiting. Patient has history of the same. No treatments prior to arrival.  The history is provided by the patient.    Past Medical History  Diagnosis Date  . Umbilical hernia   . Abdominal pain   . Chest pain   . Colon polyps 08/20/2012    colonoscopy for bloody stools.  . Psoriasis    Past Surgical History  Procedure Laterality Date  . Orthopedic surgery    . Cholecystectomy    . Colonoscopy w/ polypectomy  08/16/2012    +colon polyps; repeat in 5 years.   Family History  Problem Relation Age of Onset  . Diabetes Father   . Colon cancer Neg Hx   . Rectal cancer Neg Hx   . Stomach cancer Neg Hx    History  Substance Use Topics  . Smoking status: Current Every Day Smoker -- 1.00 packs/day    Types: Cigarettes  . Smokeless tobacco: Never Used  . Alcohol Use: 1.2 oz/week    2 Cans of beer per week     Comment: every other day    Review of Systems  Constitutional: Negative for fever.  HENT: Positive for congestion and rhinorrhea. Negative for sore throat.   Eyes: Positive for photophobia, pain, discharge (tearing), redness and visual disturbance.  Gastrointestinal: Negative for nausea and vomiting.  Skin: Negative for color  change and wound.  Neurological: Negative for headaches.      Allergies  Review of patient's allergies indicates no known allergies.  Home Medications   Prior to Admission medications   Medication Sig Start Date End Date Taking? Authorizing Provider  aspirin EC 81 MG tablet Take 243 mg by mouth once.    Historical Provider, MD   BP 147/83  Pulse 77  Temp(Src) 98 F (36.7 C) (Oral)  Resp 22  SpO2 94% Physical Exam  Nursing note and vitals reviewed. Constitutional: He appears well-developed and well-nourished.  HENT:  Head: Normocephalic and atraumatic.  Eyes:  Injected conjunctiva. Tearing noted. Exam is very difficult as patient cannot hold eyes open due to pain. + photophobia.   Neck: Normal range of motion. Neck supple.  Pulmonary/Chest: No respiratory distress.  Neurological: He is alert.  Skin: Skin is warm and dry.  Psychiatric: He has a normal mood and affect.    ED Course  Procedures (including critical care time) Labs Review Labs Reviewed - No data to display  Imaging Review No results found.   EKG Interpretation None      10:52 PM Patient seen and examined. Work-up initiated. Medications ordered.   Vital signs reviewed and are as follows: Filed Vitals:   08/02/13 2233  BP: 147/83  Pulse: 77  Temp: 98 F (36.7 C)  Resp: 22  Pain is tolerable after administration of IM Dilaudid.  Proparacaine administered. PO Percocet given prior to discharge.  Patient counseled. Ophthalmology referral given if no improvement in 2 days. Patient told to return with worsening symptoms, uncontrolled pain, or other concerns. Counseled on use of use of erythromycin.   Patient counseled on use of narcotic pain medications. Counseled not to combine these medications with others containing tylenol. Urged not to drink alcohol, drive, or perform any other activities that requires focus while taking these medications. The patient verbalizes understanding and agrees with  the plan.   MDM   Final diagnoses:  Photokeratitis of both eyes   Do not suspect foreign body. History is consistent with photokeratitis. Pain is controlled. Patient counseled. No ophthalmologic emergency suspected. Appropriate followup and pain control given for home.  No dangerous or life-threatening conditions suspected or identified by history, physical exam, and by work-up. No indications for hospitalization identified.      Carlisle Cater, PA-C 08/03/13 [phone]

## 2013-08-03 NOTE — ED Provider Notes (Signed)
Medical screening examination/treatment/procedure(s) were performed by non-physician practitioner and as supervising physician I was immediately available for consultation/collaboration.   EKG Interpretation None        Blanchie Dessert, MD 08/03/13 2329

## 2013-10-03 ENCOUNTER — Emergency Department (HOSPITAL_COMMUNITY)
Admission: EM | Admit: 2013-10-03 | Discharge: 2013-10-03 | Disposition: A | Discharge status: Home / Self Care | Payer: BC Managed Care – PPO | Attending: Emergency Medicine | Admitting: Emergency Medicine

## 2013-10-03 ENCOUNTER — Encounter (HOSPITAL_COMMUNITY): Payer: Self-pay | Admitting: Emergency Medicine

## 2013-10-03 DIAGNOSIS — Z860100 Personal history of colon polyps, unspecified: Secondary | ICD-10-CM | POA: Insufficient documentation

## 2013-10-03 DIAGNOSIS — IMO0002 Reserved for concepts with insufficient information to code with codable children: Secondary | ICD-10-CM | POA: Insufficient documentation

## 2013-10-03 DIAGNOSIS — Z7982 Long term (current) use of aspirin: Secondary | ICD-10-CM | POA: Insufficient documentation

## 2013-10-03 DIAGNOSIS — L237 Allergic contact dermatitis due to plants, except food: Secondary | ICD-10-CM

## 2013-10-03 DIAGNOSIS — L259 Unspecified contact dermatitis, unspecified cause: Secondary | ICD-10-CM

## 2013-10-03 DIAGNOSIS — F17200 Nicotine dependence, unspecified, uncomplicated: Secondary | ICD-10-CM | POA: Insufficient documentation

## 2013-10-03 DIAGNOSIS — Z8719 Personal history of other diseases of the digestive system: Secondary | ICD-10-CM | POA: Insufficient documentation

## 2013-10-03 DIAGNOSIS — L255 Unspecified contact dermatitis due to plants, except food: Secondary | ICD-10-CM | POA: Insufficient documentation

## 2013-10-03 MED ORDER — HYDROXYZINE HCL 25 MG PO TABS: 25.0000 mg | ORAL_TABLET | Freq: Four times a day (QID) | ORAL | Status: DC | PRN

## 2013-10-03 MED ORDER — PREDNISONE 10 MG PO TABS: ORAL_TABLET | ORAL | Status: DC

## 2013-10-03 MED ORDER — DEXAMETHASONE SODIUM PHOSPHATE 10 MG/ML IJ SOLN
10.0000 mg | Freq: Once | INTRAMUSCULAR | Status: AC
  Administered 2013-10-03: 10 mg via INTRAMUSCULAR
  Filled 2013-10-03: qty 1

## 2013-10-03 MED ORDER — HYDROCORTISONE 2.5 % EX LOTN: TOPICAL_LOTION | Freq: Two times a day (BID) | CUTANEOUS | Status: DC

## 2013-10-03 NOTE — ED Notes (Signed)
Red raised bumps on r/upper arm, itching rash on both arms. Pt noted this am. Reports working in yard cutting grass yesterday

## 2013-10-03 NOTE — Progress Notes (Signed)
[facility] provided pt with a list of primary care resources and a [facility] [employer] [facility] application to help patient establish primary care.  °

## 2013-10-03 NOTE — ED Provider Notes (Signed)
CSN: [id number]     Arrival date & time 10/03/13  1100 History   First MD Initiated Contact with Patient 10/03/13 1123     Chief Complaint  Patient presents with  . Rash    itching rash to arms, noted this am     (Consider location/radiation/quality/duration/timing/severity/associated sxs/prior Treatment) Patient is a 53 y.o. male presenting with rash. The history is provided by the patient and medical records. No language interpreter was used.  Rash Associated symptoms: no fever, no nausea, no shortness of breath and not vomiting     NICKALAUS CROOKE is a 53 y.o. male  with a hx of psoriasis presents to the Emergency Department complaining of gradual, persistent, progressively worsening rash to the bilateral arms onset yesterday evening after working in the yard.  Patient reports history of severe allergy to poison ivy and symptoms of the same today. He reports rash located to the bilateral forearms with "blistering."  He reports that in the past he has received a shot in medication which has resolved the symptoms. She reports rashes no rales on his body.  He has not attempted any over-the-counter treatments. Nothing makes it better or worse. He has associated itching at the site. Patient denies fever, chills, headache, neck pain, shortness of breath, abdominal pain.  Past Medical History  Diagnosis Date  . Umbilical hernia   . Abdominal pain   . Chest pain   . Colon polyps 08/20/2012    colonoscopy for bloody stools.  . Psoriasis    Past Surgical History  Procedure Laterality Date  . Orthopedic surgery    . Cholecystectomy    . Colonoscopy w/ polypectomy  08/16/2012    +colon polyps; repeat in 5 years.   Family History  Problem Relation Age of Onset  . Diabetes Father   . Colon cancer Neg Hx   . Rectal cancer Neg Hx   . Stomach cancer Neg Hx    History  Substance Use Topics  . Smoking status: Current Every Day Smoker -- 1.00 packs/day    Types: Cigarettes  . Smokeless  tobacco: Never Used  . Alcohol Use: 1.2 oz/week    2 Cans of beer per week     Comment: every other day    Review of Systems  Constitutional: Negative for fever.  Eyes: Negative for pain and redness.  Respiratory: Negative for chest tightness and shortness of breath.   Cardiovascular: Negative for chest pain.  Gastrointestinal: Negative for nausea and vomiting.  Skin: Positive for color change and rash. Negative for wound.  Allergic/Immunologic: Negative for immunocompromised state.  Neurological: Negative for weakness and numbness.  Hematological: Does not bruise/bleed easily.  Psychiatric/Behavioral: The patient is not nervous/anxious.       Allergies  Review of patient's allergies indicates no known allergies.  Home Medications   Prior to Admission medications   Medication Sig Start Date End Date Taking? Authorizing Provider  aspirin EC 81 MG tablet Take 243 mg by mouth once.    Historical Provider, MD  hydrocortisone 2.5 % lotion Apply topically 2 (two) times daily. 10/03/13   Sholonda Jobst, PA-C  hydrOXYzine (ATARAX/VISTARIL) 25 MG tablet Take 1 tablet (25 mg total) by mouth every 6 (six) hours as needed for itching. 10/03/13   Ever Gustafson, PA-C  oxyCODONE-acetaminophen (PERCOCET/ROXICET) 5-325 MG per tablet Take 1-2 tablets by mouth every 6 (six) hours as needed for severe pain. 08/02/13   Carlisle Cater, PA-C  predniSONE (DELTASONE) 10 MG tablet 4 tabs po daily  x 3 days, then 3 tabs x 3 days, then 2 tabs x 3 days, then 1 tab x 3 days, then 0.5 tabs x 4 days 10/03/13   Jarrett Soho Danamarie Minami, PA-C   BP 116/61  Pulse 80  Temp(Src) 98.3 F (36.8 C) (Oral)  Resp 18  SpO2 96% Physical Exam  Nursing note and vitals reviewed. Constitutional: He is oriented to person, place, and time. He appears well-developed and well-nourished. No distress.  HENT:  Head: Normocephalic and atraumatic.  Eyes: Conjunctivae are normal. No scleral icterus.  Neck: Normal range of  motion.  Cardiovascular: Normal rate, regular rhythm, normal heart sounds and intact distal pulses.   No murmur heard. Pulmonary/Chest: Effort normal and breath sounds normal. No respiratory distress. He has no wheezes.  Abdominal: Soft. Bowel sounds are normal. He exhibits no distension. There is no tenderness.  Lymphadenopathy:    He has no cervical adenopathy.  Neurological: He is alert and oriented to person, place, and time.  Skin: Skin is warm and dry. He is not diaphoretic. There is erythema.  Erythematous and vesicular rash over her bilateral forearms from the wrist to above the antecubital fossa No involvement of the face, chest or back  Psychiatric: He has a normal mood and affect.    ED Course  Procedures (including critical care time) Labs Review Labs Reviewed - No data to display  Imaging Review No results found.   EKG Interpretation None      MDM   Final diagnoses:  Poison ivy  Contact dermatitis   Charlann Noss presents with hx and PE consistent with poison Ivy. Discussed contagiousness & home care. Treated in ED w decadron. DC with recommendation to get OTC Zanfel. Script for hydrocortisone lotion and PO prednisone taper. Strict return precautions discussed including development of rash on the face or genitals. Pt afebrile and in NAD prior to dc. Airway intact without compromise. No facial or genital involvement of rash.  The patient is to followup in with a primary care provider or urgent care in 3 days for rash check.  I have personally reviewed patient's vitals, nursing note and any pertinent labs or imaging. At this time, it has been determined that no acute conditions requiring further emergency intervention. The patient/guardian have been advised of the diagnosis and plan. I reviewed all labs and imaging including any potential incidental findings. We have discussed signs and symptoms that warrant return to the ED, such as listed above  Patient/guardian has  voiced understanding and agreed to follow-up with the PCP or specialist in 3 days for further rash evaluation.  Vital signs are stable at discharge.   BP 116/61  Pulse 80  Temp(Src) 98.3 F (36.8 C) (Oral)  Resp 18  SpO2 96%         Abigail Butts, PA-C 10/03/13 1225

## 2013-10-03 NOTE — Discharge Instructions (Signed)
1. Medications: prednisone, atarax, hydrocortisone cream, usual home medications 2. Treatment: rest, drink plenty of fluids, Zanfel from pharmacy for removal of the oil 3. Follow Up: Please followup with your primary doctor in 3 days for discussion of your diagnoses and further evaluation after today's visit; if you do not have a primary care doctor use the resource guide provided to find one;   [place]owner is a inflammation of the skin (contact dermatitis) caused by touching the allergens on the leaves of the ivy plant following previous exposure to the plant. The rash usually appears 48 hours after exposure. The rash is usually bumps (papules) or blisters (vesicles) in a linear pattern. Depending on your own sensitivity, the rash may simply cause redness and itching, or it may also progress to blisters which may break open. These must be well cared for to prevent secondary bacterial (germ) infection, followed by scarring. Keep any open areas dry, clean, dressed, and covered with an antibacterial ointment if needed. The eyes may also get puffy. The puffiness is worst in the morning and gets better as the day progresses. This dermatitis usually heals without scarring, within 2 to 3 weeks without treatment. HOME CARE INSTRUCTIONS  Thoroughly wash with soap and water as soon as you have been exposed to poison ivy. You have about one half hour to remove the plant resin before it will cause the rash. This washing will destroy the oil or antigen on the skin that is causing, or will cause, the rash. Be sure to wash under your fingernails as any plant resin there will continue to spread the rash. Do not rub skin vigorously when washing affected area. Poison ivy cannot spread if no oil from the plant remains on your body. A rash that has progressed to weeping sores will not spread the rash unless you have not washed thoroughly. It is also important to wash any clothes you have been wearing as these may  carry active allergens. The rash will return if you wear the unwashed clothing, even several days later. Avoidance of the plant in the future is the best measure. Poison ivy plant can be recognized by the number of leaves. Generally, poison ivy has three leaves with flowering branches on a single stem. Diphenhydramine may be purchased over the counter and used as needed for itching. Do not drive with this medication if it makes you drowsy.Ask your caregiver about medication for children. SEEK MEDICAL CARE IF:  Open sores develop.  Redness spreads beyond area of rash.  You notice purulent (pus-like) discharge.  You have increased pain.  Other signs of infection develop (such as fever). Document Released: 04/01/2000 Document Revised: 06/27/2011 Document Reviewed: 02/18/2009 [facility] Patient Information 2015 [place], [place]. This information is not intended to replace advice given to you by your health care provider. Make sure you discuss any questions you have with your health care provider.   Emergency Department Resource Guide 1) Find a Doctor and Pay Out of Pocket Although you won't have to find out who is covered by your insurance plan, it is a good idea to ask around and get recommendations. You will then need to call the office and see if the doctor you have chosen will accept you as a new patient and what types of options they offer for patients who are self-pay. Some doctors offer discounts or will set up payment plans for their patients who do not have insurance, but you will need to ask so you aren't surprised  when you get to your appointment.  2) Contact Your Local Health Department Not all health departments have doctors that can see patients for sick visits, but many do, so it is worth a call to see if yours does. If you don't know where your local health department is, you can check in your phone book. The CDC also has a tool to help you locate your state's health department, and  many state websites also have listings of all of their local health departments.  3) Find a [facility] If your illness is not likely to be very severe or complicated, you may want to try a walk in clinic. These are popping up all over the country in pharmacies, drugstores, and shopping centers. They're usually staffed by nurse practitioners or physician assistants that have been trained to treat common illnesses and complaints. They're usually fairly quick and inexpensive. However, if you have serious medical issues or chronic medical problems, these are probably not your best option.  No Primary Care Doctor: - Call Health Connect at  [phone] - they can help you locate a primary care doctor that  accepts your insurance, provides certain services, etc. - Physician Referral Service- [phone]  Chronic Pain Problems: Organization         Address  Phone   Notes  [facility]  [phone] Patients need to be referred by their primary care doctor.   Medication Assistance: Organization         Address  Phone   Notes  [facility] Medication [facility] [address] [place], [place] [address] [phone] --Must be a resident of [facility] -- Must have NO insurance coverage whatsoever (no Medicaid/ Medicare, etc.) -- The pt. MUST have a primary care doctor that directs their care regularly and follows them in the community   MedAssist  [phone]   [employer]  [phone]    Agencies that provide inexpensive medical care: Organization         Address  Phone   Notes  [place]  [phone]   [facility]    [phone]   [facility] [place], [place] [address] [phone]   [place] [address], [place] [phone]   Planned Parenthood    [phone]   [facility]    [phone]   [place]  and [place] [address], [place] Phone:  [phone], Fax:  [phone] Hours of Operation:  9 am - 6 pm, M-F.  Also accepts Medicaid/Medicare and self-pay.  [facility] for [place], [address], [place] Phone: [phone], Fax: [phone]. Hours of Operation:  8:30 am - 5:30 pm, M-F.  Also accepts Medicaid and self-pay.  [facility] [place] [address] [place] Phone: [phone]   [place], [place], [place] [phone], Ext. [phone] Mondays & Thursdays: 7-9 AM.  First 15 patients are seen on a first come, first serve basis.    [place] Providers:  Organization         Address  Phone   Notes  [facility] [address], [place] [phone] Also accepts self-pay patients.  [place], [place]  [phone]   [place]  [address], [address], [place] [phone]   [place] [address] [place] [phone]   Lucianne Lei [address], [place]   [phone] Only accepts [place] Access [place] patients after they have their name applied to their card.   Self-Pay (no insurance) in [facility]:  Organization         Address  Phone   Notes  Sickle Cell Patients, [facility] [place] [phone]   [facility] [place] [phone]   [facility] [place]  [place], [place], [place] [phone]   [facility]/Dr. Osei-Bonsu  [address], [place] or [address], [place] [phone] Phone number for both [place] and [place] locations is the same.  Urgent Medical and [facility] [address] [place] [phone]   [place], [place] or [address] [phone]   [facility] [address], [place] [phone], phone; [phone], fax Sees patients 1st and 3rd Saturday of every month.  Must not qualify for public or private insurance (i.e. Medicaid, Medicare, [place] Health Choice, Veterans' Benefits)  Household income should be no more than 200% of the poverty level The clinic cannot treat you if you are pregnant or think you are pregnant  Sexually transmitted diseases are not treated at the clinic.    Dental Care: Organization         Address  Phone  Notes  [facility] Department of [facility] [phone] Accepts children up to age 77 who are enrolled in [place] or [place]; pregnant women with a Medicaid card; and children who have applied for Medicaid or [place] Health Choice, but were declined, whose parents can pay a reduced fee at time of service.  [facility] Department of [facility]  [address], [place] [phone] Accepts children up to age 12 who are enrolled in [place] or [place]; pregnant women with a Medicaid card; and children who have applied for Medicaid or [place] Health Choice, but were declined, whose parents can pay a reduced fee at time of service.  [facility] Adult Dental Access PROGRAM  [place] [phone] Patients are seen by appointment only. Walk-ins are not accepted. Brenda will see patients 81 years of age and older. Monday - Tuesday (8am-5pm) Most Wednesdays (8:30-5pm) $30 per visit, cash only  [facility] Adult Dental Access PROGRAM  [address], [facility] [phone] Patients are seen by appointment only. Walk-ins are not accepted. [place] will see patients 17 years of age and older. One Wednesday Evening (Monthly: Volunteer Based).  $30 per visit, cash only   [place]  [phone] for adults; Children under age 78, call Graduate Pediatric Dentistry at [phone]. Children aged 14-14, please call [phone] to request a pediatric application.  Dental services are provided in all areas of dental care including fillings, crowns and bridges, complete and partial dentures, implants, gum treatment, root canals, and extractions. Preventive care is also provided. Treatment is provided to both adults and children. Patients are selected via a lottery and there is often a waiting list.  [facility] [address], [place]  [phone] [email or web address]   [facility] [address] [place], [place] [phone], Ext. [phone] Second and Fourth Thursday of each month, opens at 6:30 AM; Clinic ends at 9 AM.  Patients are seen on a first-come first-served basis, and a limited number are seen during each clinic.   [facility]  [address] [place], [place] [phone]   Eligibility Requirements You must have lived in [place], [place], or [place] counties for at least the last three months.   You cannot be eligible for state or federal sponsored [employer], including [employer], [place], or [employer].   You generally cannot be eligible for healthcare insurance through your employer.    How to apply: Eligibility screenings are held every Tuesday and Wednesday afternoon from 1:00 pm until 4:00 pm. You do not need an appointment for the interview!  [facility] [address], [place], [place]   [place]  [place] Department  [place]  [phone]    Behavioral Health Resources in the Community: Intensive Outpatient Programs Organization         Address  Phone  Notes  [place]. [address], [place],  [place] [phone]   [facility] [address], [place], [place]   ADS: Alcohol & Drug Svcs [address] [place], [place]   [place] [address]  [place], [place] or [phone]   Substance Abuse Resources Organization         Address  Phone  Notes  Alcohol and Drug Services  [phone]   [place]  [phone]   The [place]   Chinita Pester  [phone]   Residential & Outpatient Substance Abuse Program  [phone]   Psychological Services Organization         Address  Phone  Notes  [facility] [place]  [place]  [phone]   [place] [address] [place] or [phone]    Mobile Crisis Teams Organization         Address  Phone  Notes  [facility]rapeutic [facility]atives, Mobile Crisis Care Unit  [phone]   Assertive Psychotherapeutic Services  [address] [place], [place]   [place] [address] [place] [phone]    Self-Help/Support Groups Organization         Address  Phone             Notes  [place]. of [place] - variety of support groups  [place] Call for more information  Narcotics Anonymous (NA), [facility]aring [facility] [address], [employer] [place]  2 meetings at this location   Special educational needs teacher         Address  Phone  Notes  [facility]idential Treatment [place],    [place]  [phone]   [facility]  [address] [place] [address], [place], [place]   [place], [place] [phone] Admissions: 8am-3pm M-F  Incentives Substance [place] [address]    [place], [place] [phone]   [facility] [address] Jadene Pierini [place], [place]   [facility] [place]  Barbara Cower  [place], [place]   Insight Programs - Intensive Outpatient [address], [place], [place]   [facility] [place].,  [place], [place] [phone] or [phone]   [facility]idential Treatment Services (RTS) [address] [place], [place] Accepts Medicaid  [facility] [place] [address]  [place] [phone] Substance Abuse/Addiction Treatment   [facility] Organization         Address  Phone  Notes  [facility]  [phone]   Domenic Schwab, PhD [address] Arlis Porta [place], [place]   [phone] or [phone]   [place], [place] [phone]   [address], [place], [place] [phone] Insurance/Medicaid/sponsorship through [facility] and Families [address], [address]                                    [place], [place] [phone] [place] [address], [place] [phone]    Dr. Adele Schilder  [phone]   [facility] Dept. 1) [address], [place] 2) [place] 3)  [place] [address], [place] [phone]  [phone]   [place] [phone] or [phone] (After Hours)

## 2013-10-04 NOTE — ED Provider Notes (Signed)
Medical screening examination/treatment/procedure(s) were performed by non-physician practitioner and as supervising physician I was immediately available for consultation/collaboration.   EKG Interpretation None       Virgel Manifold, MD 10/04/13 [phone]

## 2014-02-20 ENCOUNTER — Emergency Department (HOSPITAL_COMMUNITY)
Admission: EM | Admit: 2014-02-20 | Discharge: 2014-02-20 | Disposition: A | Discharge status: Home / Self Care | Payer: BC Managed Care – PPO | Attending: Emergency Medicine | Admitting: Emergency Medicine

## 2014-02-20 ENCOUNTER — Encounter (HOSPITAL_COMMUNITY): Payer: Self-pay | Admitting: Emergency Medicine

## 2014-02-20 DIAGNOSIS — Z7952 Long term (current) use of systemic steroids: Secondary | ICD-10-CM | POA: Insufficient documentation

## 2014-02-20 DIAGNOSIS — Z86010 Personal history of colonic polyps: Secondary | ICD-10-CM | POA: Insufficient documentation

## 2014-02-20 DIAGNOSIS — H5711 Ocular pain, right eye: Secondary | ICD-10-CM

## 2014-02-20 DIAGNOSIS — Z872 Personal history of diseases of the skin and subcutaneous tissue: Secondary | ICD-10-CM | POA: Insufficient documentation

## 2014-02-20 DIAGNOSIS — Z720 Tobacco use: Secondary | ICD-10-CM | POA: Insufficient documentation

## 2014-02-20 DIAGNOSIS — Z7982 Long term (current) use of aspirin: Secondary | ICD-10-CM | POA: Insufficient documentation

## 2014-02-20 MED ORDER — FLUORESCEIN SODIUM 1 MG OP STRP
1.0000 | ORAL_STRIP | Freq: Once | OPHTHALMIC | Status: AC
  Administered 2014-02-20: 2 via OPHTHALMIC

## 2014-02-20 MED ORDER — BACITRACIN-POLYMYXIN B OP OINT
1.0000 "application " | TOPICAL_OINTMENT | Freq: Two times a day (BID) | OPHTHALMIC | Status: DC
  Administered 2014-02-20: 1 via OPHTHALMIC
  Filled 2014-02-20: qty 3.5

## 2014-02-20 MED ORDER — TETRACAINE HCL 0.5 % OP SOLN
1.0000 [drp] | Freq: Once | OPHTHALMIC | Status: AC
  Administered 2014-02-20: 1 [drp] via OPHTHALMIC

## 2014-02-20 NOTE — ED Provider Notes (Signed)
CSN: [id number]     Arrival date & time 02/20/14  1700 History  This chart was scribed for non-physician practitioner Cleatrice Burke, PA-C, working with Orpah Greek, MD by Zola Button, ED Scribe. This patient was seen in room WTR5/WTR5 and the patient's care was started at 5:45 PM.    Chief Complaint  Patient presents with  . Eye Pain     The history is provided by the patient. No language interpreter was used.   HPI Comments: Tyler Clark is a 53 y.o. male who presents to the Emergency Department complaining of gradual onset, constant, burning right eye pain with drainage that began after he woke up one day about 3 weeks ago. He states that it feels like there is something in his eye. Patient reports having associated photophobia. Patient has been using tissues to dab at his eye, but has not been rubbing his eyes. He denies any visual disturbances. He denies injury, prior surgeries on his eyes, and using glasses or contacts; however, he had seen an eye specialist a few years ago because he had piece of metal in his eye. He does not know who this was. He reports that his left eye is normal.   Past Medical History  Diagnosis Date  . Umbilical hernia   . Abdominal pain   . Chest pain   . Colon polyps 08/20/2012    colonoscopy for bloody stools.  . Psoriasis    Past Surgical History  Procedure Laterality Date  . Orthopedic surgery    . Cholecystectomy    . Colonoscopy w/ polypectomy  08/16/2012    +colon polyps; repeat in 5 years.   Family History  Problem Relation Age of Onset  . Diabetes Father   . Colon cancer Neg Hx   . Rectal cancer Neg Hx   . Stomach cancer Neg Hx    History  Substance Use Topics  . Smoking status: Current Every Day Smoker -- 1.00 packs/day    Types: Cigarettes  . Smokeless tobacco: Never Used  . Alcohol Use: 1.2 oz/week    2 Cans of beer per week     Comment: every other day    Review of Systems  Eyes: Positive for photophobia, pain and  discharge. Negative for visual disturbance.  All other systems reviewed and are negative.     Allergies  Review of patient's allergies indicates no known allergies.  Home Medications   Prior to Admission medications   Medication Sig Start Date End Date Taking? Authorizing Provider  aspirin EC 81 MG tablet Take 243 mg by mouth once.    Historical Provider, MD  hydrocortisone 2.5 % lotion Apply topically 2 (two) times daily. 10/03/13   Channah Godeaux Muthersbaugh, PA-C  hydrOXYzine (ATARAX/VISTARIL) 25 MG tablet Take 1 tablet (25 mg total) by mouth every 6 (six) hours as needed for itching. 10/03/13   Andoni Busch Muthersbaugh, PA-C  oxyCODONE-acetaminophen (PERCOCET/ROXICET) 5-325 MG per tablet Take 1-2 tablets by mouth every 6 (six) hours as needed for severe pain. 08/02/13   Carlisle Cater, PA-C  predniSONE (DELTASONE) 10 MG tablet 4 tabs po daily x 3 days, then 3 tabs x 3 days, then 2 tabs x 3 days, then 1 tab x 3 days, then 0.5 tabs x 4 days 10/03/13   Jarrett Soho Muthersbaugh, PA-C   BP 142/84 mmHg  Pulse 96  Temp(Src) 98.3 F (36.8 C) (Oral)  Resp 18  SpO2 100% Physical Exam  Constitutional: He is oriented to person, place, and time. He  appears well-developed and well-nourished. No distress.  HENT:  Head: Normocephalic and atraumatic.  Right Ear: External ear normal.  Left Ear: External ear normal.  Nose: Nose normal.  Eyes: EOM are normal. Pupils are equal, round, and reactive to light. Right conjunctiva is injected. Right conjunctiva has no hemorrhage. Left conjunctiva is not injected. Left conjunctiva has no hemorrhage.  Slit lamp exam:      The right eye shows no corneal abrasion, no corneal flare, no corneal ulcer, no foreign body, no hypopyon and no fluorescein uptake.  Photophobia. No pain with consensual light reflex. IOP of right eye WNL.  Neck: Normal range of motion. No tracheal deviation present.  Cardiovascular: Normal rate, regular rhythm and normal heart sounds.   Pulmonary/Chest:  Effort normal and breath sounds normal. No stridor.  Abdominal: Soft. He exhibits no distension. There is no tenderness.  Musculoskeletal: Normal range of motion.  Neurological: He is alert and oriented to person, place, and time.  Skin: Skin is warm and dry. He is not diaphoretic.  Psychiatric: He has a normal mood and affect. His behavior is normal.  Nursing note and vitals reviewed.   ED Course  Procedures  DIAGNOSTIC STUDIES: Oxygen Saturation is 100% on room air, normal by my interpretation.    COORDINATION OF CARE: 5:47 PM-Discussed treatment plan which includes visual acuity screening and fluorescein test with pt at bedside and pt agreed to plan.    Labs Review Labs Reviewed - No data to display  Imaging Review No results found.   EKG Interpretation None      MDM   Final diagnoses:  Eye pain, right    Patient presents to ED for evaluation of eye pain. No fluorescein dye uptake. No pain with EOM. IOP of right eye is WNL. Watery drainage. Concern for iritis. Discussed case with Dr. Satira Sark who recommends bacitracin opthalmic ointment and f/u in his office. Discussed reasons to return to ED immediately. Vital signs stable for discharge. Dr. Betsey Holiday evaluated patient and agrees with plan. Patient / Family / Caregiver informed of clinical course, understand medical decision-making process, and agree with plan.   I personally performed the services described in this documentation, which was scribed in my presence. The recorded information has been reviewed and is accurate.    Elwyn Lade, PA-C 02/26/14 1204  Orpah Greek, MD 02/27/14 [phone]

## 2014-02-20 NOTE — ED Notes (Signed)
Per pt, states right eye drainage and pain for 3 weeks

## 2014-02-20 NOTE — ED Provider Notes (Signed)
Patient presented to the ER with eye pain. Patient reports 3 weeks of progressively worsening right eye pain with some drainage.  Face to face Exam: HEENT - PERRLA Lungs - CTAB Heart - RRR, no M/R/G Abd - S/NT/ND Neuro - alert, oriented x3  Plan: Intraocular pressure normal. Fluorescein dye with Woods lamp revealed no uptake. Patient does have photophobia and pain. Consider iritis. Will have an Cleatrice Burke, [facility] consult ophthalmology for recommendations of initiation of care as well as follow-up tomorrow.   Orpah Greek, MD 02/20/14 1820

## 2014-02-20 NOTE — Discharge Instructions (Signed)
Please use warm compresses on your right eye. Use the bacitracin ophthalmic drops twice daily. Please call the phone number listed above tomorrow to be seen in their office. Return to emergency department for new or worsening symptoms.

## 2014-02-21 NOTE — Progress Notes (Signed)
Pt presented to [facility] outpt pharmacy today because he could not afford the eye gtts he was prescribed after the ER visit yesterday.  Pt was set up with [facility] program and the copays were overridden to be $0.00.  Confirmed with pharmacist, Janit Bern, that he was successfully enrolled.   Sandi Mariscal, RN BSN MHA CCM  Case Manager, Trauma Service/Unit 44M [phone]

## 2014-07-21 ENCOUNTER — Encounter: Payer: Self-pay | Admitting: Gastroenterology

## 2015-10-25 ENCOUNTER — Emergency Department (HOSPITAL_COMMUNITY): Payer: Commercial Managed Care - HMO

## 2015-10-25 ENCOUNTER — Encounter (HOSPITAL_COMMUNITY): Payer: Self-pay | Admitting: *Deleted

## 2015-10-25 ENCOUNTER — Emergency Department (HOSPITAL_COMMUNITY)
Admission: EM | Admit: 2015-10-25 | Discharge: 2015-10-25 | Disposition: A | Discharge status: Home / Self Care | Payer: Commercial Managed Care - HMO | Attending: Emergency Medicine | Admitting: Emergency Medicine

## 2015-10-25 DIAGNOSIS — M79645 Pain in left finger(s): Secondary | ICD-10-CM | POA: Diagnosis not present

## 2015-10-25 DIAGNOSIS — Z7982 Long term (current) use of aspirin: Secondary | ICD-10-CM | POA: Diagnosis not present

## 2015-10-25 DIAGNOSIS — F17210 Nicotine dependence, cigarettes, uncomplicated: Secondary | ICD-10-CM | POA: Insufficient documentation

## 2015-10-25 MED ORDER — IBUPROFEN 800 MG PO TABS: 800.0000 mg | ORAL_TABLET | Freq: Three times a day (TID) | ORAL | Status: DC | PRN

## 2015-10-25 MED ORDER — METHOCARBAMOL 500 MG PO TABS: 500.0000 mg | ORAL_TABLET | Freq: Four times a day (QID) | ORAL | Status: DC | PRN

## 2015-10-25 NOTE — ED Notes (Signed)
Pt c/o of left thumb pain x 2 weeks.  Pt denies any injuries.  Pt's skin color is appropriate for ethnicity.  Cap refill less than 3 seconds. Pt has good range of motion. Pt hasn't taken any medications for pain.

## 2015-10-25 NOTE — ED Provider Notes (Signed)
CSN: [id number]     Arrival date & time 10/25/15  1202 History   By signing my name below, I, Meriel Flavors, attest that this documentation has been prepared under the direction and in the presence of [facility] PA-C.  Electronically Signed: Meriel Flavors, ED Scribe. 10/25/2015. 4:34 PM  Chief Complaint  Patient presents with  . Thumb Pain    The history is provided by the patient. No language interpreter was used.   HPI Comments: Tyler Clark is a 55 y.o. male who presents to the Emergency Department complaining of constant aching left thumb pain onset two weeks ago. Pt also reports intermittent sharp, shooting pain that occurs with unspecified movements. 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  Pt reports he had the same issue with similar symptoms last year but was never seen by a doctor. Pt reports that the pain feels the same as prior. Pt also states he felt a pop a few days ago while unscrewing an oil filter on a car.Pt reports no exacerbating or alleviating factors. Pt denies taking any medication for pain. Pt denies any recent injury or numbness to the extremity.   Past Medical History  Diagnosis Date  . Umbilical hernia   . Abdominal pain   . Chest pain   . Colon polyps 08/20/2012    colonoscopy for bloody stools.  . Psoriasis    Past Surgical History  Procedure Laterality Date  . Orthopedic surgery    . Cholecystectomy    . Colonoscopy w/ polypectomy  08/16/2012    +colon polyps; repeat in 5 years.  . Open anterior shoulder reconstruction    . Knee surgery     Family History  Problem Relation Age of Onset  . Diabetes Father   . Colon cancer Neg Hx   . Rectal cancer Neg Hx   . Stomach cancer Neg Hx    Social History  Substance Use Topics  . Smoking status: Current Every Day Smoker -- 1.00 packs/day    Types: Cigarettes  . Smokeless tobacco: Never Used  . Alcohol Use: 1.2 oz/week    2 Cans of beer per week     Comment: every  other day    Review of Systems  Constitutional: Negative for fever and chills.  Musculoskeletal: Positive for arthralgias (left thumb). Negative for joint swelling.  Skin: Negative for color change, pallor and wound.  Allergic/Immunologic: Negative for immunocompromised state.  Neurological: Negative for numbness.  Hematological: Does not bruise/bleed easily.  Psychiatric/Behavioral: Negative for self-injury.    Allergies  Review of patient's allergies indicates no known allergies.  Home Medications   Prior to Admission medications   Medication Sig Start Date End Date Taking? Authorizing Provider  aspirin EC 81 MG tablet Take 243 mg by mouth once.    Historical Provider, MD  hydrocortisone 2.5 % lotion Apply topically 2 (two) times daily. 10/03/13   Hannah Muthersbaugh, PA-C  hydrOXYzine (ATARAX/VISTARIL) 25 MG tablet Take 1 tablet (25 mg total) by mouth every 6 (six) hours as needed for itching. 10/03/13   Hannah Muthersbaugh, PA-C  oxyCODONE-acetaminophen (PERCOCET/ROXICET) 5-325 MG per tablet Take 1-2 tablets by mouth every 6 (six) hours as needed for severe pain. 08/02/13   Carlisle Cater, PA-C  predniSONE (DELTASONE) 10 MG tablet 4 tabs po daily x 3 days, then 3 tabs x 3 days, then 2 tabs x 3 days, then 1 tab x 3 days, then 0.5  tabs x 4 days 10/03/13   Jarrett Soho Muthersbaugh, PA-C   BP 139/85 mmHg  Pulse 69  Temp(Src) 97.7 F (36.5 C) (Oral)  Resp 18  Ht 5\' 6"  (1.676 m)  Wt 180 lb (81.647 kg)  BMI 29.07 kg/m2  SpO2 100% Physical Exam  Constitutional: He appears well-developed and well-nourished. No distress.  HENT:  Head: Normocephalic and atraumatic.  Neck: Neck supple.  Pulmonary/Chest: Effort normal.  Musculoskeletal: Normal range of motion. He exhibits tenderness.  Full active range of motion of all digits, strength 5/5, sensation intact, capillary refill < 2 seconds.  Negative Finkelstein test, negative tinnel and phalen.  mild tenderness throughout thenar imminence,  dorsal hand between first and second metacarpals No erythema or warmth. radial pulses intact  Neurological: He is alert.  Skin: He is not diaphoretic.  Nursing note and vitals reviewed.   ED Course  Procedures  DIAGNOSTIC STUDIES: Oxygen Saturation is 100% on RA, normal by my interpretation.  COORDINATION OF CARE: 4:22 PM-Will order imaging. Discussed treatment plan with pt at bedside and pt agreed to plan.   Labs Review Labs Reviewed - No data to display  Imaging Review Dg Finger Thumb Left  10/25/2015  CLINICAL DATA:  Two week history of pain EXAM: LEFT THUMB 2+V COMPARISON:  Left hand Sep 16, 2003 FINDINGS: Frontal, oblique, and lateral views were obtained. There is no apparent fracture or dislocation. The joint spaces appear normal. No erosive change. IMPRESSION: No fracture or dislocation.  No apparent arthropathy. Electronically Signed   By: Lowella Grip III M.D.   On: 10/25/2015 13:43   I have personally reviewed and evaluated these images and lab results as part of my medical decision-making.   EKG Interpretation None      MDM   Final diagnoses:  Thumb pain, left    Afebrile, nontoxic patient with injury to his left thumb while performing daily activities.  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   Xray negative.  Neurovascularly intact.    D/C home with velcro thumb spica, medications, hand surgeon follow up.  Discussed result, findings, treatment, and follow up  with patient.  Pt given return precautions.  Pt verbalizes understanding and agrees with plan.      I personally performed the services described in this documentation, which was scribed in my presence. The recorded information has been reviewed and is accurate.      Clayton Bibles, PA-C 10/25/15 1646  Davonna Belling, MD 10/25/15 [phone]

## 2015-10-25 NOTE — Discharge Instructions (Signed)
Read the information below.  Use the prescribed medication as directed.  Please discuss all new medications with your pharmacist.  You may return to the Emergency Department at any time for worsening condition or any new symptoms that concern you.  If you develop uncontrolled pain, weakness or numbness of the extremity, severe discoloration of the skin, or you are unable to use your hand, return to the ER for a recheck.      Musculoskeletal Pain Musculoskeletal pain is muscle and boney aches and pains. These pains can occur in any part of the body. Your caregiver may treat you without knowing the cause of the pain. They may treat you if blood or urine tests, X-rays, and other tests were normal.  CAUSES There is often not a definite cause or reason for these pains. These pains may be caused by a type of germ (virus). The discomfort may also come from overuse. Overuse includes working out too hard when your body is not fit. Boney aches also come from weather changes. Bone is sensitive to atmospheric pressure changes. HOME CARE INSTRUCTIONS   Ask when your test results will be ready. Make sure you get your test results.  Only take over-the-counter or prescription medicines for pain, discomfort, or fever as directed by your caregiver. If you were given medications for your condition, do not drive, operate machinery or power tools, or sign legal documents for 24 hours. Do not drink alcohol. Do not take sleeping pills or other medications that may interfere with treatment.  Continue all activities unless the activities cause more pain. When the pain lessens, slowly resume normal activities. Gradually increase the intensity and duration of the activities or exercise.  During periods of severe pain, bed rest may be helpful. Lay or sit in any position that is comfortable.  Putting ice on the injured area.  Put ice in a bag.  Place a towel between your skin and the bag.  Leave the ice on for 15 to 20  minutes, 3 to 4 times a day.  Follow up with your caregiver for continued problems and no reason can be found for the pain. If the pain becomes worse or does not go away, it may be necessary to repeat tests or do additional testing. Your caregiver may need to look further for a possible cause. SEEK IMMEDIATE MEDICAL CARE IF:  You have pain that is getting worse and is not relieved by medications.  You develop chest pain that is associated with shortness or breath, sweating, feeling sick to your stomach (nauseous), or throw up (vomit).  Your pain becomes localized to the abdomen.  You develop any new symptoms that seem different or that concern you. MAKE SURE YOU:   Understand these instructions.  Will watch your condition.  Will get help right away if you are not doing well or get worse.   This information is not intended to replace advice given to you by your health care provider. Make sure you discuss any questions you have with your health care provider.   Document Released: 04/04/2005 Document Revised: 06/27/2011 Document Reviewed: 12/07/2012 Elsevier Interactive Patient Education [employer].

## 2015-11-15 ENCOUNTER — Emergency Department (HOSPITAL_COMMUNITY): Payer: Commercial Managed Care - HMO

## 2015-11-15 ENCOUNTER — Encounter (HOSPITAL_COMMUNITY): Payer: Self-pay

## 2015-11-15 ENCOUNTER — Emergency Department (HOSPITAL_COMMUNITY)
Admission: EM | Admit: 2015-11-15 | Discharge: 2015-11-16 | Disposition: A | Discharge status: Home / Self Care | Payer: Commercial Managed Care - HMO | Attending: Emergency Medicine | Admitting: Emergency Medicine

## 2015-11-15 DIAGNOSIS — Z79899 Other long term (current) drug therapy: Secondary | ICD-10-CM | POA: Insufficient documentation

## 2015-11-15 DIAGNOSIS — T07XXXA Unspecified multiple injuries, initial encounter: Secondary | ICD-10-CM

## 2015-11-15 DIAGNOSIS — IMO0001 Reserved for inherently not codable concepts without codable children: Secondary | ICD-10-CM

## 2015-11-15 DIAGNOSIS — Y999 Unspecified external cause status: Secondary | ICD-10-CM | POA: Insufficient documentation

## 2015-11-15 DIAGNOSIS — R51 Headache: Secondary | ICD-10-CM | POA: Insufficient documentation

## 2015-11-15 DIAGNOSIS — T148 Other injury of unspecified body region: Secondary | ICD-10-CM | POA: Diagnosis not present

## 2015-11-15 DIAGNOSIS — M20012 Mallet finger of left finger(s): Secondary | ICD-10-CM | POA: Insufficient documentation

## 2015-11-15 DIAGNOSIS — F17210 Nicotine dependence, cigarettes, uncomplicated: Secondary | ICD-10-CM | POA: Insufficient documentation

## 2015-11-15 DIAGNOSIS — Y92410 Unspecified street and highway as the place of occurrence of the external cause: Secondary | ICD-10-CM | POA: Insufficient documentation

## 2015-11-15 DIAGNOSIS — Y9389 Activity, other specified: Secondary | ICD-10-CM | POA: Insufficient documentation

## 2015-11-15 DIAGNOSIS — R52 Pain, unspecified: Secondary | ICD-10-CM

## 2015-11-15 DIAGNOSIS — M79645 Pain in left finger(s): Secondary | ICD-10-CM | POA: Diagnosis present

## 2015-11-15 MED ORDER — METHOCARBAMOL 500 MG PO TABS
1000.0000 mg | ORAL_TABLET | Freq: Once | ORAL | Status: AC
  Administered 2015-11-15: 1000 mg via ORAL
  Filled 2015-11-15: qty 2

## 2015-11-15 MED ORDER — METHOCARBAMOL 500 MG PO TABS
500.0000 mg | ORAL_TABLET | Freq: Two times a day (BID) | ORAL | 0 refills | Status: DC
Start: 2015-11-15 — End: 2019-05-09

## 2015-11-15 MED ORDER — KETOROLAC TROMETHAMINE 30 MG/ML IJ SOLN
30.0000 mg | Freq: Once | INTRAMUSCULAR | Status: AC
  Administered 2015-11-15: 30 mg via INTRAVENOUS
  Filled 2015-11-15: qty 1

## 2015-11-15 MED ORDER — NAPROXEN 500 MG PO TABS: 500.0000 mg | ORAL_TABLET | Freq: Two times a day (BID) | ORAL | 0 refills | Status: DC

## 2015-11-15 MED ORDER — ONDANSETRON HCL 4 MG/2ML IJ SOLN
4.0000 mg | Freq: Once | INTRAMUSCULAR | Status: AC
  Administered 2015-11-15: 4 mg via INTRAVENOUS
  Filled 2015-11-15: qty 2

## 2015-11-15 MED ORDER — MORPHINE SULFATE (PF) 4 MG/ML IV SOLN
4.0000 mg | INTRAVENOUS | Status: DC | PRN
Start: 2015-11-15 — End: 2015-11-16
  Administered 2015-11-15 (×2): 4 mg via INTRAVENOUS
  Filled 2015-11-15 (×2): qty 1

## 2015-11-15 MED ORDER — OXYCODONE-ACETAMINOPHEN 5-325 MG PO TABS: 2.0000 | ORAL_TABLET | ORAL | 0 refills | Status: DC | PRN

## 2015-11-15 MED ORDER — OXYCODONE-ACETAMINOPHEN 5-325 MG PO TABS
2.0000 | ORAL_TABLET | Freq: Once | ORAL | Status: AC
  Administered 2015-11-15: 2 via ORAL
  Filled 2015-11-15: qty 2

## 2015-11-15 NOTE — ED Notes (Signed)
Patient transported to X-ray 
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
## 2015-11-15 NOTE — ED Triage Notes (Signed)
Pt

## 2015-11-16 ENCOUNTER — Encounter (HOSPITAL_COMMUNITY): Payer: Self-pay | Admitting: *Deleted

## 2015-11-16 ENCOUNTER — Emergency Department (HOSPITAL_COMMUNITY)
Admission: EM | Admit: 2015-11-16 | Discharge: 2015-11-16 | Disposition: A | Discharge status: Home / Self Care | Payer: Commercial Managed Care - HMO | Attending: Emergency Medicine | Admitting: Emergency Medicine

## 2015-11-16 ENCOUNTER — Emergency Department (HOSPITAL_COMMUNITY): Payer: Commercial Managed Care - HMO

## 2015-11-16 DIAGNOSIS — R61 Generalized hyperhidrosis: Secondary | ICD-10-CM | POA: Diagnosis not present

## 2015-11-16 DIAGNOSIS — F17210 Nicotine dependence, cigarettes, uncomplicated: Secondary | ICD-10-CM | POA: Insufficient documentation

## 2015-11-16 DIAGNOSIS — R55 Syncope and collapse: Secondary | ICD-10-CM | POA: Diagnosis present

## 2015-11-16 DIAGNOSIS — K5790 Diverticulosis of intestine, part unspecified, without perforation or abscess without bleeding: Secondary | ICD-10-CM | POA: Insufficient documentation

## 2015-11-16 LAB — CBC WITH DIFFERENTIAL/PLATELET
BASOS ABS: 0 10*3/uL (ref 0.0–0.1)
BASOS PCT: 0 %
EOS PCT: 1 %
Eosinophils Absolute: 0.1 10*3/uL (ref 0.0–0.7)
HEMATOCRIT: 45.3 % (ref 39.0–52.0)
Hemoglobin: 15.2 g/dL (ref 13.0–17.0)
LYMPHS PCT: 16 %
Lymphs Abs: 1.7 10*3/uL (ref 0.7–4.0)
MCH: 31.2 pg (ref 26.0–34.0)
MCHC: 33.6 g/dL (ref 30.0–36.0)
MCV: 93 fL (ref 78.0–100.0)
MONO ABS: 0.7 10*3/uL (ref 0.1–1.0)
MONOS PCT: 6 %
NEUTROS ABS: 8.1 10*3/uL — AB (ref 1.7–7.7)
Neutrophils Relative %: 77 %
PLATELETS: 200 10*3/uL (ref 150–400)
RBC: 4.87 MIL/uL (ref 4.22–5.81)
RDW: 14.1 % (ref 11.5–15.5)
WBC: 10.5 10*3/uL (ref 4.0–10.5)

## 2015-11-16 LAB — BASIC METABOLIC PANEL
Anion gap: 9 (ref 5–15)
BUN: 11 mg/dL (ref 6–20)
CALCIUM: 8.2 mg/dL — AB (ref 8.9–10.3)
CO2: 22 mmol/L (ref 22–32)
Chloride: 105 mmol/L (ref 101–111)
Creatinine, Ser: 1.29 mg/dL — ABNORMAL HIGH (ref 0.61–1.24)
GFR calc Af Amer: >60 mL/min (ref >60)
GLUCOSE: 121 mg/dL — AB (ref 65–99)
Potassium: 4 mmol/L (ref 3.5–5.1)
Sodium: 136 mmol/L (ref 135–145)

## 2015-11-16 LAB — CBG MONITORING, ED: Glucose-Capillary: 153 mg/dL — ABNORMAL HIGH (ref 65–99)

## 2015-11-16 LAB — I-STAT CG4 LACTIC ACID, ED: Lactic Acid, Venous: 1.89 mmol/L (ref 0.5–1.9)

## 2015-11-16 MED ORDER — SODIUM CHLORIDE 0.9 % IV SOLN
1000.0000 mL | Freq: Once | INTRAVENOUS | Status: AC
  Administered 2015-11-16: 1000 mL via INTRAVENOUS

## 2015-11-16 MED ORDER — SODIUM CHLORIDE 0.9 % IV SOLN
1000.0000 mL | INTRAVENOUS | Status: DC
  Administered 2015-11-16: 1000 mL via INTRAVENOUS

## 2015-11-16 MED ORDER — IOPAMIDOL (ISOVUE-300) INJECTION 61%
INTRAVENOUS | Status: AC
  Administered 2015-11-16: 100 mL
  Filled 2015-11-16: qty 100

## 2015-11-16 NOTE — ED Notes (Signed)
Pt and family understood dc material. NAD noted. Scripts given at dc 

## 2015-11-16 NOTE — ED Notes (Signed)
Pt returned from ct

## 2015-11-16 NOTE — ED Notes (Signed)
Patient left at this time with all belongings. 
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
## 2015-11-16 NOTE — ED Notes (Signed)
Pt c/o nausea.  
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
## 2015-11-16 NOTE — ED Notes (Signed)
Ambulated patient in room with one assist.

## 2015-11-16 NOTE — Discharge Instructions (Signed)
Continue instructions given to you at the last ED visit.

## 2015-11-23 ENCOUNTER — Other Ambulatory Visit (HOSPITAL_COMMUNITY): Payer: Self-pay | Admitting: Orthopedic Surgery

## 2015-11-23 DIAGNOSIS — M5489 Other dorsalgia: Secondary | ICD-10-CM

## 2015-11-26 ENCOUNTER — Other Ambulatory Visit (HOSPITAL_COMMUNITY): Payer: Self-pay | Admitting: Orthopedic Surgery

## 2015-11-26 ENCOUNTER — Encounter (HOSPITAL_COMMUNITY)
Admission: RE | Admit: 2015-11-26 | Discharge: 2015-11-26 | Disposition: A | Discharge status: Home / Self Care | Payer: Commercial Managed Care - HMO | Source: Ambulatory Visit | Attending: Orthopedic Surgery | Admitting: Orthopedic Surgery

## 2015-11-26 DIAGNOSIS — M5489 Other dorsalgia: Secondary | ICD-10-CM

## 2015-11-26 DIAGNOSIS — M791 Myalgia: Secondary | ICD-10-CM | POA: Diagnosis present

## 2015-11-26 MED ORDER — TECHNETIUM TC 99M MEDRONATE IV KIT
25.0000 | PACK | Freq: Once | INTRAVENOUS | Status: AC | PRN
  Administered 2015-11-26: 25 via INTRAVENOUS

## 2016-05-01 ENCOUNTER — Emergency Department (HOSPITAL_COMMUNITY)
Admission: EM | Admit: 2016-05-01 | Discharge: 2016-05-01 | Disposition: A | Discharge status: Home / Self Care | Payer: Commercial Managed Care - HMO | Attending: Emergency Medicine | Admitting: Emergency Medicine

## 2016-05-01 ENCOUNTER — Emergency Department (HOSPITAL_COMMUNITY): Payer: Commercial Managed Care - HMO

## 2016-05-01 DIAGNOSIS — Z79899 Other long term (current) drug therapy: Secondary | ICD-10-CM | POA: Insufficient documentation

## 2016-05-01 DIAGNOSIS — J40 Bronchitis, not specified as acute or chronic: Secondary | ICD-10-CM | POA: Insufficient documentation

## 2016-05-01 DIAGNOSIS — F17210 Nicotine dependence, cigarettes, uncomplicated: Secondary | ICD-10-CM | POA: Insufficient documentation

## 2016-05-01 DIAGNOSIS — R05 Cough: Secondary | ICD-10-CM | POA: Diagnosis present

## 2016-05-01 MED ORDER — AZITHROMYCIN 250 MG PO TABS: 250.0000 mg | ORAL_TABLET | Freq: Every day | ORAL | 0 refills | Status: DC

## 2016-05-01 MED ORDER — ALBUTEROL SULFATE HFA 108 (90 BASE) MCG/ACT IN AERS: 2.0000 | INHALATION_SPRAY | RESPIRATORY_TRACT | 0 refills | Status: DC | PRN

## 2016-05-01 NOTE — Discharge Instructions (Signed)
Return if any problems.

## 2016-05-01 NOTE — ED Triage Notes (Signed)
Pt c/o sore throat x 1 month. Chronic cough. No n/v/diarrhea, abdominal pain. No fevers or chills. Pt unable to tolerate strep swab, refused.

## 2016-05-01 NOTE — ED Provider Notes (Signed)
[facility] DEPT Provider Note   CSN: [id number] Arrival date & time: 05/01/16  1112   By signing my name below, I, Soijett Blue, attest that this documentation has been prepared under the direction and in the presence of Alyse Low, PA-C Electronically Signed: Soijett Blue, ED Scribe. 05/01/16. 1:41 PM.  History   Chief Complaint Chief Complaint  Patient presents with  . Sore Throat    HPI Tyler Clark is a 56 y.o. male who presents to the Emergency Department complaining of sore throat onset 1 month ago. Pt notes that he has had lung issues in the past and was treated with an inhaler. He is having associated symptoms of chronic cough x 15 years. He has tried lozenges without medications for the relief of his symptoms. He denies fever, chills, trouble swallowing, and any other symptoms. Denies allergies to medications. Pt notes that he smokes cigarettes. Denies daily medication use.    The history is provided by the patient. No language interpreter was used.    Past Medical History:  Diagnosis Date  . Abdominal pain   . Chest pain   . Colon polyps 08/20/2012   colonoscopy for bloody stools.  . Psoriasis   . Umbilical hernia     There are no active problems to display for this patient.   Past Surgical History:  Procedure Laterality Date  . CHOLECYSTECTOMY    . COLONOSCOPY W/ POLYPECTOMY  08/16/2012   +colon polyps; repeat in 5 years.  Marland Kitchen KNEE SURGERY    . OPEN ANTERIOR SHOULDER RECONSTRUCTION    . ORTHOPEDIC SURGERY         Home Medications    Prior to Admission medications   Medication Sig Start Date End Date Taking? Authorizing Provider  methocarbamol (ROBAXIN) 500 MG tablet Take 1 tablet (500 mg total) by mouth 2 (two) times daily. 11/15/15   Tanna Furry, MD  naproxen (NAPROSYN) 500 MG tablet Take 1 tablet (500 mg total) by mouth 2 (two) times daily. 11/15/15   Tanna Furry, MD  oxyCODONE-acetaminophen (PERCOCET/ROXICET) 5-325 MG tablet Take 2 tablets by mouth  every 4 (four) hours as needed. 11/15/15   Tanna Furry, MD    Family History Family History  Problem Relation Age of Onset  . Diabetes Father   . Colon cancer Neg Hx   . Rectal cancer Neg Hx   . Stomach cancer Neg Hx     Social History Social History  Substance Use Topics  . Smoking status: Current Every Day Smoker    Packs/day: 1.00    Types: Cigarettes  . Smokeless tobacco: Never Used  . Alcohol use 1.2 oz/week    2 Cans of beer per week     Comment: every other day     Allergies   Patient has no known allergies.   Review of Systems Review of Systems  Constitutional: Negative for chills and fever.  HENT: Positive for sore throat. Negative for trouble swallowing.   Respiratory: Positive for cough.     Physical Exam Updated Vital Signs BP 136/86 (BP Location: Left Arm)   Pulse 76   Temp 98.3 F (36.8 C) (Oral)   Resp 18   SpO2 100%   Physical Exam  Constitutional: He is oriented to person, place, and time. He appears well-developed and well-nourished. No distress.  HENT:  Head: Normocephalic and atraumatic.  Mouth/Throat: Uvula is midline and mucous membranes are normal. Posterior oropharyngeal erythema present. No oropharyngeal exudate or posterior oropharyngeal edema.  Eyes: EOM are  normal.  Neck: Neck supple.  Cardiovascular: Normal rate.   Pulmonary/Chest: Effort normal. No respiratory distress. He has rhonchi.  Rhonchi noted.  Abdominal: He exhibits no distension.  Musculoskeletal: Normal range of motion.  Neurological: He is alert and oriented to person, place, and time.  Skin: Skin is warm and dry.  Psychiatric: He has a normal mood and affect. His behavior is normal.  Nursing note and vitals reviewed.   ED Treatments / Results  DIAGNOSTIC STUDIES: Oxygen Saturation is 100% on RA, nl by my interpretation.    COORDINATION OF CARE: 1:40 PM Discussed treatment plan with pt at bedside which includes CXR, inhaler, abx Rx, and pt agreed to  plan.   Radiology Dg Chest 2 View  Result Date: 05/01/2016 CLINICAL DATA:  Sore throat, cough with phlegm, SOB, and pain around entire jow for a little over a month.Smoker- 1 pack/day EXAM: CHEST  2 VIEW COMPARISON:  11/15/2015 and 05/04/2013. FINDINGS: The heart size and mediastinal contours are within normal limits. Both lungs are clear. No pleural effusion or pneumothorax. The visualized skeletal structures are intact. IMPRESSION: No active cardiopulmonary disease. Electronically Signed   By: Lajean Manes M.D.   On: 05/01/2016 13:16    Procedures Procedures (including critical care time)  Medications Ordered in ED Medications - No data to display   Initial Impression / Assessment and Plan / ED Course  I have reviewed the triage vital signs and the nursing notes.  Pertinent imaging results that were available during my care of the patient were reviewed by me and considered in my medical decision making (see chart for details).  Clinical Course       Final Clinical Impressions(s) / ED Diagnoses   Final diagnoses:  Bronchitis    New Prescriptions Discharge Medication List as of 05/01/2016  1:49 PM     Meds ordered this encounter  Medications  . azithromycin (ZITHROMAX) 250 MG tablet    Sig: Take 1 tablet (250 mg total) by mouth daily. Take first 2 tablets together, then 1 every day until finished.    Dispense:  6 tablet    Refill:  0    Order Specific Question:   Supervising Provider    Answer:   MILLER, BRIAN [[id number]]  . albuterol (PROVENTIL HFA;VENTOLIN HFA) 108 (90 Base) MCG/ACT inhaler    Sig: Inhale 2 puffs into the lungs every 4 (four) hours as needed for wheezing or shortness of breath.    Dispense:  1 Inhaler    Refill:  0    Order Specific Question:   Supervising Provider    Answer:   Noemi Chapel [[id number]]   I personally performed the services in this documentation, which was scribed in my presence.  The recorded information has been reviewed and considered.    Ronnald Collum.   An After Visit Summary was printed and given to the patient. Fransico Meadow, PA-C 05/01/16 [place], PA-C 05/01/16 [place], MD 05/02/16 [phone]

## 2016-12-20 ENCOUNTER — Emergency Department (HOSPITAL_COMMUNITY)
Admission: EM | Admit: 2016-12-20 | Discharge: 2016-12-20 | Disposition: A | Discharge status: Home / Self Care | Payer: 59 | Attending: Emergency Medicine | Admitting: Emergency Medicine

## 2016-12-20 ENCOUNTER — Emergency Department (HOSPITAL_COMMUNITY): Payer: 59

## 2016-12-20 DIAGNOSIS — S50312A Abrasion of left elbow, initial encounter: Secondary | ICD-10-CM | POA: Diagnosis not present

## 2016-12-20 DIAGNOSIS — M25562 Pain in left knee: Secondary | ICD-10-CM | POA: Insufficient documentation

## 2016-12-20 DIAGNOSIS — S0081XA Abrasion of other part of head, initial encounter: Secondary | ICD-10-CM | POA: Diagnosis not present

## 2016-12-20 DIAGNOSIS — S0990XA Unspecified injury of head, initial encounter: Secondary | ICD-10-CM | POA: Diagnosis present

## 2016-12-20 DIAGNOSIS — Z23 Encounter for immunization: Secondary | ICD-10-CM | POA: Insufficient documentation

## 2016-12-20 DIAGNOSIS — M25552 Pain in left hip: Secondary | ICD-10-CM | POA: Diagnosis not present

## 2016-12-20 DIAGNOSIS — Y998 Other external cause status: Secondary | ICD-10-CM | POA: Diagnosis not present

## 2016-12-20 DIAGNOSIS — Y9389 Activity, other specified: Secondary | ICD-10-CM | POA: Diagnosis not present

## 2016-12-20 DIAGNOSIS — R52 Pain, unspecified: Secondary | ICD-10-CM

## 2016-12-20 DIAGNOSIS — Y92410 Unspecified street and highway as the place of occurrence of the external cause: Secondary | ICD-10-CM | POA: Diagnosis not present

## 2016-12-20 LAB — ETHANOL: Alcohol, Ethyl (B): <5 mg/dL (ref <5)

## 2016-12-20 LAB — CBC
HEMATOCRIT: 46.5 % (ref 39.0–52.0)
HEMOGLOBIN: 15.9 g/dL (ref 13.0–17.0)
MCH: 31.5 pg (ref 26.0–34.0)
MCHC: 34.2 g/dL (ref 30.0–36.0)
MCV: 92.1 fL (ref 78.0–100.0)
Platelets: 181 10*3/uL (ref 150–400)
RBC: 5.05 MIL/uL (ref 4.22–5.81)
RDW: 13.5 % (ref 11.5–15.5)
WBC: 10.7 10*3/uL — ABNORMAL HIGH (ref 4.0–10.5)

## 2016-12-20 LAB — I-STAT CHEM 8, ED
BUN: 12 mg/dL (ref 6–20)
CALCIUM ION: 1.17 mmol/L (ref 1.15–1.40)
CHLORIDE: 99 mmol/L — AB (ref 101–111)
Creatinine, Ser: 0.9 mg/dL (ref 0.61–1.24)
Glucose, Bld: 135 mg/dL — ABNORMAL HIGH (ref 65–99)
HEMATOCRIT: 47 % (ref 39.0–52.0)
Hemoglobin: 16 g/dL (ref 13.0–17.0)
Potassium: 3.2 mmol/L — ABNORMAL LOW (ref 3.5–5.1)
SODIUM: 137 mmol/L (ref 135–145)
TCO2: 27 mmol/L (ref 22–32)

## 2016-12-20 LAB — COMPREHENSIVE METABOLIC PANEL
ALK PHOS: 33 U/L — AB (ref 38–126)
ALT: 30 U/L (ref 17–63)
ANION GAP: 7 (ref 5–15)
AST: 28 U/L (ref 15–41)
Albumin: 3.5 g/dL (ref 3.5–5.0)
BUN: 11 mg/dL (ref 6–20)
CO2: 27 mmol/L (ref 22–32)
Calcium: 9.4 mg/dL (ref 8.9–10.3)
Chloride: 102 mmol/L (ref 101–111)
Creatinine, Ser: 1.07 mg/dL (ref 0.61–1.24)
GFR calc Af Amer: >60 mL/min (ref >60)
GFR calc non Af Amer: >60 mL/min (ref >60)
GLUCOSE: 144 mg/dL — AB (ref 65–99)
POTASSIUM: 3.2 mmol/L — AB (ref 3.5–5.1)
SODIUM: 136 mmol/L (ref 135–145)
Total Bilirubin: 0.4 mg/dL (ref 0.3–1.2)
Total Protein: 6.6 g/dL (ref 6.5–8.1)

## 2016-12-20 LAB — SAMPLE TO BLOOD BANK

## 2016-12-20 MED ORDER — NAPROXEN 250 MG PO TABS: 250.0000 mg | ORAL_TABLET | Freq: Two times a day (BID) | ORAL | 0 refills | Status: DC

## 2016-12-20 MED ORDER — ONDANSETRON HCL 4 MG/2ML IJ SOLN
4.0000 mg | Freq: Once | INTRAMUSCULAR | Status: DC
  Filled 2016-12-20: qty 2

## 2016-12-20 MED ORDER — BACITRACIN ZINC 500 UNIT/GM EX OINT: 1.0000 "application " | TOPICAL_OINTMENT | Freq: Two times a day (BID) | CUTANEOUS | 1 refills | Status: DC

## 2016-12-20 MED ORDER — FENTANYL CITRATE (PF) 100 MCG/2ML IJ SOLN
50.0000 ug | Freq: Once | INTRAMUSCULAR | Status: AC
  Administered 2016-12-20: 50 ug via INTRAVENOUS
  Filled 2016-12-20: qty 2

## 2016-12-20 MED ORDER — KETOROLAC TROMETHAMINE 30 MG/ML IJ SOLN
15.0000 mg | Freq: Once | INTRAMUSCULAR | Status: AC
  Administered 2016-12-20: 15 mg via INTRAVENOUS
  Filled 2016-12-20: qty 1

## 2016-12-20 MED ORDER — TETANUS-DIPHTH-ACELL PERTUSSIS 5-2.5-18.5 LF-MCG/0.5 IM SUSP
0.5000 mL | Freq: Once | INTRAMUSCULAR | Status: AC
  Administered 2016-12-20: 0.5 mL via INTRAMUSCULAR
  Filled 2016-12-20: qty 0.5

## 2016-12-20 NOTE — ED Notes (Signed)
Pt ambulatory to restroom w/ unsteady gait; c/o left knee pain
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
## 2016-12-20 NOTE — ED Notes (Signed)
ED Provider at bedside. 
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
## 2016-12-21 ENCOUNTER — Encounter (INDEPENDENT_AMBULATORY_CARE_PROVIDER_SITE_OTHER): Payer: Self-pay | Admitting: Orthopedic Surgery

## 2016-12-21 ENCOUNTER — Ambulatory Visit (INDEPENDENT_AMBULATORY_CARE_PROVIDER_SITE_OTHER): Payer: 59 | Admitting: Orthopedic Surgery

## 2016-12-21 DIAGNOSIS — M545 Low back pain, unspecified: Secondary | ICD-10-CM

## 2016-12-21 MED ORDER — METHYLPREDNISOLONE 4 MG PO TABS: ORAL_TABLET | ORAL | 0 refills | Status: DC
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
## 2016-12-28 ENCOUNTER — Encounter (INDEPENDENT_AMBULATORY_CARE_PROVIDER_SITE_OTHER): Payer: Self-pay | Admitting: Orthopedic Surgery

## 2016-12-28 ENCOUNTER — Ambulatory Visit (INDEPENDENT_AMBULATORY_CARE_PROVIDER_SITE_OTHER): Payer: 59 | Admitting: Orthopedic Surgery

## 2016-12-28 DIAGNOSIS — M25552 Pain in left hip: Secondary | ICD-10-CM

## 2016-12-28 DIAGNOSIS — M25562 Pain in left knee: Secondary | ICD-10-CM

## 2016-12-28 DIAGNOSIS — M5442 Lumbago with sciatica, left side: Secondary | ICD-10-CM
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
## 2017-01-03 ENCOUNTER — Telehealth (INDEPENDENT_AMBULATORY_CARE_PROVIDER_SITE_OTHER): Payer: Self-pay | Admitting: Orthopedic Surgery

## 2017-01-03 NOTE — Telephone Encounter (Signed)
Note written. Called patient advised could pick up at front desk.

## 2017-01-03 NOTE — Telephone Encounter (Signed)
Please advise. thx 

## 2017-01-03 NOTE — Telephone Encounter (Signed)
Pt states his CT Scan is sch'd after the return to work note date & he was wanting to know if Dr Marlou Sa wanted to extend his out of work note until after his CT Scan and follow up visit.

## 2017-01-03 NOTE — Telephone Encounter (Signed)
ok 

## 2017-01-05 ENCOUNTER — Ambulatory Visit
Admission: RE | Admit: 2017-01-05 | Discharge: 2017-01-05 | Disposition: A | Discharge status: Home / Self Care | Payer: 59 | Source: Ambulatory Visit | Attending: Orthopedic Surgery | Admitting: Orthopedic Surgery

## 2017-01-05 DIAGNOSIS — M25552 Pain in left hip: Secondary | ICD-10-CM

## 2017-01-11 ENCOUNTER — Encounter (INDEPENDENT_AMBULATORY_CARE_PROVIDER_SITE_OTHER): Payer: Self-pay | Admitting: Orthopedic Surgery

## 2017-01-11 ENCOUNTER — Ambulatory Visit (INDEPENDENT_AMBULATORY_CARE_PROVIDER_SITE_OTHER): Payer: 59 | Admitting: Orthopedic Surgery

## 2017-01-11 DIAGNOSIS — M25552 Pain in left hip: Secondary | ICD-10-CM

## 2017-01-11 NOTE — Progress Notes (Signed)
Office Visit Note   Patient: Tyler Clark           Date of Birth: Apr 11, 1961           MRN: [id number] Visit Date: 01/11/2017 Requested by: No referring provider defined for this encounter. PCP: System, Pcp Not In  Subjective: Chief Complaint  Patient presents with  . Pelvis    f/u to review CT scan    HPI: Tyler Clark is a 56 year old patient with left-sided hip pain.  Had plain radiographs done at the time of injury which were normal on the lumbar spine.  CT pelvis also obtained since I have last seen him.  That is negative for fracture.  He is still unable to work.  He is still using crutches.  He can't really afford any medication.  Samples provided last clinic visit which she states didn't help much.              ROS: All systems reviewed are negative as they relate to the chief complaint within the history of present illness.  Patient denies  fevers or chills.   Assessment & Plan: Visit Diagnoses:  1. Pain of left hip joint     Plan: Impression is soft tissue mediated back and left-sided hip and pelvis pain with no fracture.  Plan is out of work for 3 more weeks.  Continue trying to mobilize more.  In 3 weeks he should have a better idea about when he can return to work.  Follow-Up Instructions: Return in about 3 weeks (around 02/01/2017).   Orders:  No orders of the defined types were placed in this encounter.  No orders of the defined types were placed in this encounter.     Procedures: No procedures performed   Clinical Data: No additional findings.  Objective: Vital Signs: There were no vitals taken for this visit.  Physical Exam:   Constitutional: Patient appears well-developed HEENT:  Head: Normocephalic Eyes:EOM are normal Neck: Normal range of motion Cardiovascular: Normal rate Pulmonary/chest: Effort normal Neurologic: Patient is alert Skin: Skin is warm Psychiatric: Patient has normal mood and affect    Ortho Exam: Orthopedic exam  demonstrates antalgic gait to the left but with good hip flexion strength and abduction strength bilaterally.  No groin pain with internal/external rotation of the leg.  No other masses lymph adenopathy or skin changes noted in the leg region.  He is Perfused foot.  No real muscle atrophy.  Some pain with forward lateral bending.  Does localize pain to palpation at the lateral aspect of the pelvis.  Specialty Comments:  No specialty comments available.  Imaging: No results found.   PMFS History: There are no active problems to display for this patient.  Past Medical History:  Diagnosis Date  . Abdominal pain   . Chest pain   . Colon polyps 08/20/2012   colonoscopy for bloody stools.  . Psoriasis   . Umbilical hernia     Family History  Problem Relation Age of Onset  . Diabetes Father   . Colon cancer Neg Hx   . Rectal cancer Neg Hx   . Stomach cancer Neg Hx     Past Surgical History:  Procedure Laterality Date  . CHOLECYSTECTOMY    . COLONOSCOPY W/ POLYPECTOMY  08/16/2012   +colon polyps; repeat in 5 years.  Marland Kitchen KNEE SURGERY    . OPEN ANTERIOR SHOULDER RECONSTRUCTION    . ORTHOPEDIC SURGERY     Social History   Occupational History  .  Not on file.   Social History Main Topics  . Smoking status: Current Every Day Smoker    Packs/day: 1.00    Types: Cigarettes  . Smokeless tobacco: Never Used  . Alcohol use 1.2 oz/week    2 Cans of beer per week     Comment: every other day  . Drug use: No  . Sexual activity: Not on file

## 2017-01-20 ENCOUNTER — Telehealth (INDEPENDENT_AMBULATORY_CARE_PROVIDER_SITE_OTHER): Payer: Self-pay | Admitting: Orthopedic Surgery

## 2017-01-20 NOTE — Telephone Encounter (Signed)
Patient is wanting to go to therapy. Tyler Clark for this?

## 2017-01-20 NOTE — Telephone Encounter (Signed)
Patient called asking for a referral to be sent over for PT if it could be faxed to [phone] Attn: Fanny Skates

## 2017-01-21 NOTE — Telephone Encounter (Signed)
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

## 2017-01-23 NOTE — Telephone Encounter (Signed)
rx written and faxed

## 2017-02-09 ENCOUNTER — Encounter (INDEPENDENT_AMBULATORY_CARE_PROVIDER_SITE_OTHER): Payer: Self-pay | Admitting: Orthopedic Surgery

## 2017-02-09 ENCOUNTER — Ambulatory Visit (INDEPENDENT_AMBULATORY_CARE_PROVIDER_SITE_OTHER): Payer: 59 | Admitting: Orthopedic Surgery

## 2017-02-09 DIAGNOSIS — M545 Low back pain, unspecified: Secondary | ICD-10-CM
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
## 2017-02-15 ENCOUNTER — Ambulatory Visit (INDEPENDENT_AMBULATORY_CARE_PROVIDER_SITE_OTHER): Payer: Self-pay | Admitting: Orthopedic Surgery

## 2017-02-23 ENCOUNTER — Telehealth (INDEPENDENT_AMBULATORY_CARE_PROVIDER_SITE_OTHER): Payer: Self-pay | Admitting: Orthopedic Surgery

## 2017-02-23 DIAGNOSIS — Z7689 Persons encountering health services in other specified circumstances: Secondary | ICD-10-CM
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
## 2017-02-23 NOTE — Telephone Encounter (Signed)
Please advise thanks.

## 2017-02-23 NOTE — Telephone Encounter (Signed)
Ok to rf to neirologist - hip issue is time related - he will have to wait it out for now

## 2017-02-24 NOTE — Telephone Encounter (Signed)
Referral made. IC patient to advise. No answer. LMVM for him advising.

## 2017-03-02 ENCOUNTER — Telehealth (INDEPENDENT_AMBULATORY_CARE_PROVIDER_SITE_OTHER): Payer: Self-pay | Admitting: Orthopedic Surgery

## 2017-03-02 NOTE — Telephone Encounter (Signed)
He could consider going to the emergency room and getting some type of workup there and in being referred to the neurologist on call at that point in time.  I think the neurologist has to see him at that point I can't really help him with his head pain

## 2017-03-02 NOTE — Telephone Encounter (Signed)
Tyler Clark,Tyler Clark  11-22-1960 (336)  Pt stated he is having a lot of head pain and barely sleeping. Pt was referred to [facility] will not take the pt insurance and they do not accept third party billing.Pt stated he can't pay cash at the moment.

## 2017-03-02 NOTE — Telephone Encounter (Signed)
Please review and advise with any suggestions. Thanks.

## 2017-03-02 NOTE — Telephone Encounter (Signed)
Tried calling to discuss with patient. No answer. No voicemail to LM. Can s/w patient if he calls back.

## 2017-03-06 ENCOUNTER — Other Ambulatory Visit: Payer: Self-pay

## 2017-03-06 ENCOUNTER — Encounter (HOSPITAL_COMMUNITY): Payer: Self-pay | Admitting: *Deleted

## 2017-03-06 ENCOUNTER — Emergency Department (HOSPITAL_COMMUNITY)
Admission: EM | Admit: 2017-03-06 | Discharge: 2017-03-06 | Disposition: A | Discharge status: Home / Self Care | Payer: 59 | Attending: Emergency Medicine | Admitting: Emergency Medicine

## 2017-03-06 DIAGNOSIS — F17210 Nicotine dependence, cigarettes, uncomplicated: Secondary | ICD-10-CM | POA: Diagnosis not present

## 2017-03-06 DIAGNOSIS — Z79899 Other long term (current) drug therapy: Secondary | ICD-10-CM | POA: Diagnosis not present

## 2017-03-06 DIAGNOSIS — R51 Headache: Secondary | ICD-10-CM | POA: Diagnosis present

## 2017-03-06 DIAGNOSIS — R519 Headache, unspecified: Secondary | ICD-10-CM

## 2017-03-06 MED ORDER — METOCLOPRAMIDE HCL 10 MG PO TABS
10.0000 mg | ORAL_TABLET | Freq: Once | ORAL | Status: AC
  Administered 2017-03-06: 10 mg via ORAL
  Filled 2017-03-06: qty 1

## 2017-03-06 MED ORDER — IBUPROFEN 600 MG PO TABS: 600.0000 mg | ORAL_TABLET | Freq: Four times a day (QID) | ORAL | 0 refills | Status: DC | PRN

## 2017-03-06 MED ORDER — KETOROLAC TROMETHAMINE 30 MG/ML IJ SOLN
30.0000 mg | Freq: Once | INTRAMUSCULAR | Status: AC
  Administered 2017-03-06: 30 mg via INTRAMUSCULAR
  Filled 2017-03-06: qty 1

## 2017-03-06 MED ORDER — DIPHENHYDRAMINE HCL 50 MG/ML IJ SOLN
25.0000 mg | Freq: Once | INTRAMUSCULAR | Status: AC
  Administered 2017-03-06: 25 mg via INTRAMUSCULAR
  Filled 2017-03-06: qty 1
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
## 2017-03-06 NOTE — Discharge Instructions (Signed)
You may take ibuprofen as needed for headaches. Please schedule an appointment for follow-up with Dr. Charlann Boxer at the concussion clinic. Please use the phone number provided to establish care with a primary doctor as well, this will help with referrals for future specialist if needed. If you have worsening headache, or ear headache seems to be changing, you have vision changes, weakness, or numbness in any of her extremities please return to the ED first in her evaluation.

## 2017-03-06 NOTE — ED Notes (Signed)
Pt departed in NAD, refused use of wheelchair.  
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
## 2017-03-23 ENCOUNTER — Encounter: Payer: Self-pay | Admitting: Family Medicine

## 2017-03-23 ENCOUNTER — Ambulatory Visit: Payer: 59 | Admitting: Family Medicine

## 2017-03-23 DIAGNOSIS — F0781 Postconcussional syndrome: Secondary | ICD-10-CM | POA: Diagnosis not present

## 2017-03-23 DIAGNOSIS — G44309 Post-traumatic headache, unspecified, not intractable: Secondary | ICD-10-CM

## 2017-03-23 MED ORDER — GABAPENTIN 100 MG PO CAPS: 200.0000 mg | ORAL_CAPSULE | Freq: Every day | ORAL | 3 refills | Status: DC

## 2017-03-23 NOTE — Assessment & Plan Note (Addendum)
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 Do think it is also being contributed with may be some potential underlying depression as well as may be some PTSD.  We will not diagnosed those things at this moment O.  I think that patient's lack of sleep as well as poor nutrition is also contributing to him not healing appropriately.  Encourage protein supplementations, given gabapentin to help with the headaches and nighttime.  Patient can continue with the chiropractor if he feels like he is getting some benefit.  We discussed posture and ergonomics when possible.  We discussed avoiding certain stimulating activities.  Patients will try some over-the-counter medications as well.  Follow-up with me again in 2 weeks for further evaluation and treatment.  Worsening symptoms possible advanced imaging may be necessary.

## 2017-03-23 NOTE — Patient Instructions (Signed)
Good to see you  Gabapentin 200mg  at night Over the counter get  Vitamin D 2000 IU daily  Fish oil 3 grams daily  CoQ10 400mg  at night I want to see you again in 2 weeks to make sure you are doing well

## 2017-04-06 ENCOUNTER — Ambulatory Visit: Payer: 59 | Admitting: Family Medicine

## 2017-04-06 ENCOUNTER — Encounter: Payer: Self-pay | Admitting: Family Medicine

## 2017-04-06 DIAGNOSIS — F419 Anxiety disorder, unspecified: Secondary | ICD-10-CM

## 2017-04-06 DIAGNOSIS — S0990XD Unspecified injury of head, subsequent encounter: Secondary | ICD-10-CM | POA: Diagnosis not present

## 2017-04-06 DIAGNOSIS — F0781 Postconcussional syndrome: Secondary | ICD-10-CM

## 2017-04-06 MED ORDER — TRAZODONE HCL 50 MG PO TABS
50.0000 mg | ORAL_TABLET | Freq: Every evening | ORAL | 3 refills | Status: DC | PRN
Start: 2017-04-06 — End: 2019-06-26

## 2017-04-06 NOTE — Progress Notes (Signed)
Subjective:   I, Tyler Clark, am serving as a scribe for Dr. Hulan Saas, DO. Chief Complaint: Tyler Clark, DOB: 12/20/1960, is a 56 y.o. male who presents for head injury sustained  Chief Complaint  Patient presents with  . Head Injury    Injury date : 03/23/2017 Visit #: 2  History of Present Illness:   Patient's goals/priorities: Return to baseline  Concussion Self-Reported Symptom Score Symptoms rated on a scale 1-6, in last 24 hours  Headache: 6    Nausea:0  Vomiting: 0  Balance Difficulty: 4  Dizziness: 4  Fatigue: 5  Trouble Falling Asleep: 6   Sleep More Than Usual: 0  Sleep Less Than Usual: 0  Daytime Drowsiness: 3  Photophobia:5  Phonophobia: 5  Irritability:0  Sadness: 3  Nervousness: 0  Feeling More Emotional: 0  Numbness or Tingling: 4 left side of body  Feeling Slowed Down: 4  Feeling Mentally Foggy: 4  Difficulty Concentrating: 5  Difficulty Remembering: 0  Visual Problems: 5 blurry with distance    Total Symptom Score: 63 Previous Symptom Score: 79  Review of Systems: Pertinent items are noted in HPI.  Review of History: Past Medical History:  Past Medical History:  Diagnosis Date  . Abdominal pain   . Chest pain   . Colon polyps 08/20/2012   colonoscopy for bloody stools.  . Psoriasis   . Umbilical hernia     Past Surgical History:  has a past surgical history that includes orthopedic surgery; Cholecystectomy; Colonoscopy w/ polypectomy (08/16/2012); Open anterior shoulder reconstruction; and Knee surgery. Family History: family history includes Diabetes in his father. Social History:  reports that he has been smoking cigarettes.  He has been smoking about 1.00 pack per day. he has never used smokeless tobacco. He reports that he drinks about 1.2 oz of alcohol per week. He reports that he does not use drugs. Current Medications: has a current medication list which includes the following prescription(s): albuterol, azithromycin,  bacitracin, gabapentin, ibuprofen, methocarbamol, methylprednisolone, naproxen, naproxen, oxycodone-acetaminophen, and trazodone. Allergies: has No Known Allergies.  Objective:    Physical Examination Vitals:   04/06/17 0917  BP: 138/78  Pulse: 78  SpO2: 98%   General appearance: alert, appears stated age and cooperative Head: Normocephalic, without obvious abnormality, atraumatic Eyes: conjunctivae/corneas clear. PERRL, EOM's intact. Fundi benign. Sclera anicteric. Lungs: clear to auscultation bilaterally and percussion Heart: regular rate and rhythm, S1, S2 normal, no murmur, click, rub or gallop Neurologic: CN 2-12 normal.  Sensation to pain, touch, and proprioception normal.  DTRs  normal in upper and lower extremities. No pathologic reflexes. Neg rhomberg, modified rhomberg, pronator drift, tandem gait, finger-to-nose; see post-concussion vestibular and oculomotor testing in chart Psychiatric: Oriented X3, intact recent and remote memory, judgement and insight, flat mood and affect  Concussion testing performed today: Patient did well with serial sevens, seemed to do well with vestibular neuro testing      Assessment:    Tyler Clark presents with the following concussion subtypes. [] Cognitive [] Cervical [] Vestibular [] Ocular [] Migraine [x] Anxiety/Mood   Plan:   Action/Discussion: Reviewed diagnosis, management options, expected outcomes, and the reasons for scheduled and emergent follow-up. Questions were adequately answered. Patient expressed verbal understanding and agreement with the following plan.     Active Treatment Strategies:  Fueling your brain is important for recovery. It is essential to stay well hydrated, aiming for half of your body weight in fluid ounces per day (100 lbs = 50 oz). We also recommend eating breakfast to  start your day and focus on a well-balanced diet containing lean protein, 'good' fats, and complex carbohydrates. See your nutrition /  hydration handout for more details.   Quality sleep is vital in your concussion recovery. We encourage lots of sleep for the first 24-72 hours after injury but following this period it is important to regulate your sleep cycle. We encourage 6-8 hours of quality sleep per night. See your sleep handout for more details and strategies to quality sleep.  IF NOT USING THE OPTIONS BELOW DELETE THEM  Treating your vestibular and visual dysfunction will decrease your recovery time and improve your symptoms. Begin your home vestibular exercise program as directed on your AVS.    Begin taking Amantadine medicine as directed.   Begin taking DHA supplement as directed.    Begin home exercise program for neck as directed.   Follow-up information:  Follow up appointment at [facility] in 3 weeks .    Patient Education:  Reviewed with patient the risks (i.e, a repeat concussion, post-concussion syndrome, second-impact syndrome) of returning to play prior to complete resolution, and thoroughly reviewed the signs and symptoms of concussion.Reviewed need for complete resolution of all symptoms, with rest AND exertion, prior to return to play.  Reviewed red flags for urgent medical evaluation: worsening symptoms, nausea/vomiting, intractable headache, musculoskeletal changes, focal neurological deficits.  [facility] Concussion Care Plan, which clearly outlines the plans stated above, was given to patient.  I was personally involved with the physical evaluation of and am in agreement with the assessment and treatment plan for this patient.  Greater than 50% of this encounter was spent in direct consultation with the patient in evaluation, counseling, and coordination of care. Duration of encounter: 55 minutes.  After Visit Summary printed out and provided to patient as appropriate.

## 2017-04-06 NOTE — Patient Instructions (Addendum)
Good to see you  Can increase gabapentin to 300mg  at night Start trazadone 1-2 pills nightly to help with sleep  Continue the vitamins See me again in 2 weeks and hopefully you are sleeping.

## 2017-04-06 NOTE — Assessment & Plan Note (Signed)
Patient continues to have difficulty but seems to be more secondary to sleep.  Patient started on trazodone and increase gabapentin to 300 mg at night.  We discussed the potential side effects.  We discussed that as long as patient is making progress and finally sleeping then we will seen.  The patient has any significant improvement after sleeping will continue with conservative therapy.  Patient is sleeping but no improvement in symptoms then we will consider imaging.

## 2017-04-19 NOTE — Progress Notes (Signed)
Subjective:   I, Tyler Clark, am serving as a scribe for Dr. Hulan Saas, DO.  Chief Complaint: Tyler Clark, DOB: [birth year], is a 57 y.o. male who presents for a head injury sustained on 03/23/2017. He has been working and is still having some post-concussive symptoms. He continues to have a constant headache on a daily basis that seems to have decrease in intensity. He has been using the trazadone and gabapentin at night which has helped him sleep 1-2 hours more than he use to sleep. Patient reports total hours of sleep each night to range from 3-4 hours with medication and 1-2 hours without medication.   New symptoms as well as having more radicular symptoms.  Can wake him up at night.  Has noticed the patient has been having some mild weakness of the upper extremity as well.   Injury date : 03/23/2017 Visit #: 3  History of Present Illness:   Patient's goals/priorities: Return to baseline   Concussion Self-Reported Symptom Score Symptoms rated on a scale 1-6, in last 24 hours  Headache: 4    Nausea: 0  Vomiting: 0  Balance Difficulty: 0   Dizziness: 0  Fatigue: 3  Trouble Falling Asleep: 3  Sleep More Than Usual: 1  Sleep Less Than Usual: 0  Daytime Drowsiness: 3  Photophobia: 3  Phonophobia: 4  Irritability: 0  Sadness: 1  Nervousness: 0  Feeling More Emotional: 0  Numbness or Tingling: 1  Feeling Slowed Down: 0  Feeling Mentally Foggy: 3  Difficulty Concentrating: 0  Difficulty Remembering: 0  Visual Problems: 1    Total Symptom Score: 27 Previous Symptom Score: 63  Review of Systems: Pertinent items are noted in HPI.  Review of History: Past Medical History:  Past Medical History:  Diagnosis Date  . Abdominal pain   . Chest pain   . Colon polyps 08/20/2012   colonoscopy for bloody stools.  . Psoriasis   . Umbilical hernia     Past Surgical History:  has a past surgical history that includes orthopedic surgery; Cholecystectomy; Colonoscopy w/  polypectomy (08/16/2012); Open anterior shoulder reconstruction; and Knee surgery. Family History: family history includes Diabetes in his father. Social History:  reports that he has been smoking cigarettes.  He has been smoking about 1.00 pack per day. he has never used smokeless tobacco. He reports that he drinks about 1.2 oz of alcohol per week. He reports that he does not use drugs. Current Medications: has a current medication list which includes the following prescription(s): albuterol, azithromycin, bacitracin, ibuprofen, methocarbamol, methylprednisolone, naproxen, naproxen, oxycodone-acetaminophen, trazodone, gabapentin, and venlafaxine xr. Allergies: has No Known Allergies.  Objective:    Physical Examination Vitals:   04/20/17 0828  BP: 118/80  Pulse: 76  SpO2: 95%   General appearance: alert, appears stated age and cooperative Head: Normocephalic, without obvious abnormality, atraumatic Eyes: conjunctivae/corneas clear.  Mild right nystagmus noted on extraocular movements, t. Fundi benign. Sclera anicteric.  Seem to have an exacerbation of the patient's symptoms with the headache. Lungs: clear to auscultation bilaterally and percussion Heart: regular rate and rhythm, S1, S2 normal, no murmur, click, rub or gallop Neurologic: CN 2-12 normal.  Sensation to pain, touch, and proprioception normal.  normal in upper and lower extremities. No pathologic reflexes.  Positive Rhomberg which is new, modified rhomberg, pronator drift, tandem gait, finger-to-nose;  Psychiatric: Oriented X3, intact recent and remote memory, judgement and insight, normal mood and affect  Neck: Inspection loss of lordosis. No palpable stepoffs. Positive  Spurling's with radicular symptoms going down the right arm in the C7 distribution Mild limitation in range of motion lacking the last 10 degrees of extension in the last 10 degrees of sidebending bilaterally.  Worse on the right Mild decrease in strength in  the C7 distribution on the right side mild decrease with 1+ deep tendon reflex of the triceps on the right No sensory change to C4 to T1 Negative Hoffman sign bilaterally

## 2017-04-20 ENCOUNTER — Ambulatory Visit: Payer: 59 | Admitting: Family Medicine

## 2017-04-20 ENCOUNTER — Encounter: Payer: Self-pay | Admitting: Family Medicine

## 2017-04-20 VITALS — BP 118/80 | HR 76 | Ht 67.0 in | Wt 191.0 lb

## 2017-04-20 DIAGNOSIS — R519 Headache, unspecified: Secondary | ICD-10-CM

## 2017-04-20 DIAGNOSIS — M5412 Radiculopathy, cervical region: Secondary | ICD-10-CM

## 2017-04-20 DIAGNOSIS — R51 Headache: Secondary | ICD-10-CM

## 2017-04-20 DIAGNOSIS — F0781 Postconcussional syndrome: Secondary | ICD-10-CM | POA: Diagnosis not present

## 2017-04-20 MED ORDER — VENLAFAXINE HCL ER 37.5 MG PO CP24: 37.5000 mg | ORAL_CAPSULE | Freq: Every day | ORAL | 1 refills | Status: DC

## 2017-04-20 MED ORDER — GABAPENTIN 400 MG PO CAPS: 400.0000 mg | ORAL_CAPSULE | Freq: Every day | ORAL | 1 refills | Status: DC

## 2017-04-20 NOTE — Assessment & Plan Note (Signed)
Patient is continuing to have headaches.  Seems to be on a daily process.  Patient is also having some worsening difficulty with balance on objective findings today.  We discussed with patient in great length about icing regimen, home exercise, which activities of doing which wants to avoid.  Patient will slowly increase activity.  I do feel that advanced imaging is warranted now with this being greater than 6 weeks and if anything patient is worsening.  See medications.  Follow-up again after imaging and will discuss further manage

## 2017-04-20 NOTE — Assessment & Plan Note (Signed)
Patient does have more of a cervical radiculopathy.  Patient in the C7 distribution is noticing we discussed with patient at this time and we will get advanced imaging including MRI of the cervical spine.  Patient could be a candidate for epidural or nerve root injections depending on the location.  This could be affecting patient's likelihood.  Patient is on light duty at work and will continue until imaging is done.  Follow-up again in 3 weeks.

## 2017-04-20 NOTE — Patient Instructions (Signed)
I am sorry  MRI head and neck for chronic headache,  We will do effexor 37.5 mg daily  Gabapentin 400mg  at night Continue the trazodone.  Light duty at work  See me again in 3 weeks

## 2017-04-27 ENCOUNTER — Other Ambulatory Visit: Payer: Self-pay | Admitting: Family Medicine

## 2017-04-27 DIAGNOSIS — T1590XA Foreign body on external eye, part unspecified, unspecified eye, initial encounter: Secondary | ICD-10-CM

## 2017-05-03 ENCOUNTER — Ambulatory Visit
Admission: RE | Admit: 2017-05-03 | Discharge: 2017-05-03 | Disposition: A | Discharge status: Home / Self Care | Payer: 59 | Source: Ambulatory Visit | Attending: Family Medicine | Admitting: Family Medicine

## 2017-05-03 DIAGNOSIS — T1590XA Foreign body on external eye, part unspecified, unspecified eye, initial encounter: Secondary | ICD-10-CM

## 2017-05-03 DIAGNOSIS — F0781 Postconcussional syndrome: Secondary | ICD-10-CM

## 2017-05-03 DIAGNOSIS — M5412 Radiculopathy, cervical region: Secondary | ICD-10-CM

## 2017-05-10 NOTE — Progress Notes (Signed)
[facility] [place], [place] [address] Phone: [phone] Subjective:    CC: Headache follow-up  IFO:[id number]  Tyler Clark is a 57 y.o. male coming in with complaint of headaches.  Seem to be more of a postconcussive type or posttraumatic headache.  He continued to have significant difficulty.  Started on Effexor and was to increase gabapentin to 400 mg.  Still was having difficulty.  Imaging was obtained.  This was independently visualized by me.  MRI of the brain was fairly unremarkable except for a right maxillary sinus mucosal thickening Patient was found to have disc space loss with a broad-based posterior disc protrusion at C6-C7 with severe left and moderate to severe right C7 foraminal stenosis. Patient states that he continues to have headaches daily. He continues to take gabapentin and effexor. He states that he is sleeping a little bit better. He continues to endorse phonophobia at work with some confusion throughout the day. Patient had a class on Tuesday and he was able to recall and concentrate.      Past Medical History:  Diagnosis Date  . Abdominal pain   . Chest pain   . Colon polyps 08/20/2012   colonoscopy for bloody stools.  . Psoriasis   . Umbilical hernia    Past Surgical History:  Procedure Laterality Date  . CHOLECYSTECTOMY    . COLONOSCOPY W/ POLYPECTOMY  08/16/2012   +colon polyps; repeat in 5 years.  Marland Kitchen KNEE SURGERY    . OPEN ANTERIOR SHOULDER RECONSTRUCTION    . ORTHOPEDIC SURGERY     Social History   Socioeconomic History  . Marital status: Significant Other    Spouse name: Not on file  . Number of children: Not on file  . Years of education: Not on file  . Highest education level: Not on file  Social Needs  . Financial resource strain: Not on file  . Food insecurity - worry: Not on file  . Food insecurity - inability: Not on file  . Transportation needs - medical: Not on file  . Transportation needs  - non-medical: Not on file  Occupational History  . Not on file  Tobacco Use  . Smoking status: Current Every Day Smoker    Packs/day: 1.00    Types: Cigarettes  . Smokeless tobacco: Never Used  Substance and Sexual Activity  . Alcohol use: Yes    Alcohol/week: 1.2 oz    Types: 2 Cans of beer per week    Comment: every other day  . Drug use: No  . Sexual activity: Not on file  Other Topics Concern  . Not on file  Social History Narrative  . Not on file   No Known Allergies Family History  Problem Relation Age of Onset  . Diabetes Father   . Colon cancer Neg Hx   . Rectal cancer Neg Hx   . Stomach cancer Neg Hx      Past medical history, social, surgical and family history all reviewed in electronic medical record.  No pertanent information unless stated regarding to the chief complaint.   Review of Systems:Review of systems updated and as accurate as of 05/11/17  No  visual changes, nausea, vomiting, diarrhea, constipation, dizziness, abdominal pain, skin rash, fevers, chills, night sweats, weight loss, swollen lymph nodes, body aches, joint swelling, chest pain, shortness of breath, mood changes.  Positive headaches and muscle aches  Objective  Blood pressure 140/82, pulse 80, height 5\' 7"  (1.702 m),  weight 193 lb (87.5 kg), SpO2 (!) 87 %. Systems examined below as of 05/11/17   General: No apparent distress alert and oriented x3 mood and affect normal, dressed appropriately.  HEENT: Pupils equal, extraocular movements intact  Respiratory: Patient's speak in full sentences and does not appear short of breath  Cardiovascular: No lower extremity edema, non tender, no erythema  Skin: Warm dry intact with no signs of infection or rash on extremities or on axial skeleton.  Abdomen: Soft nontender  Neuro: Cranial nerves II through XII are intact, neurovascularly intact in all extremities with 2+ DTRs and 2+ pulses.  Lymph: No lymphadenopathy of posterior or anterior cervical  chain or axillae bilaterally.  Gait normal with good balance and coordination.  MSK:  Non tender with full range of motion and good stability and symmetric strength and tone of shoulders, elbows, wrist, hip, knee and ankles bilaterally.  Neck: Inspection loss of lordosis. No palpable stepoffs. Mild positive Spurling's maneuver bilateral C7 distribution. Lacks 10-15 degrees of extension Grip strength and sensation normal in bilateral hands Strength good C4 to T1 distribution No sensory change to C4 to T1 Negative Hoffman sign bilaterally Reflexes normal Tightness of trapezius bilateral    Impression and Recommendations:     This case required medical decision making of moderate complexity.      Note: This dictation was prepared with Dragon dictation along with smaller phrase technology. Any transcriptional errors that result from this process are unintentional.

## 2017-05-11 ENCOUNTER — Ambulatory Visit: Payer: 59 | Admitting: Family Medicine

## 2017-05-11 VITALS — BP 140/82 | HR 80 | Ht 67.0 in | Wt 193.0 lb

## 2017-05-11 DIAGNOSIS — M5412 Radiculopathy, cervical region: Secondary | ICD-10-CM | POA: Diagnosis not present

## 2017-05-11 NOTE — Patient Instructions (Signed)
Good to see you New exercise for the hip Tennis ball in back left pocket  We will inject the neck  Once you have the injection then see me again 2 weeks after

## 2017-05-11 NOTE — Assessment & Plan Note (Signed)
Patient's MRI do believe that this is likely contributing to some of the headaches.  Patient given multiple different choices.  We will continue on the nitroglycerin.  Patient wants to discontinue the Effexor.  We will have patient try an epidural at C7-T1 that hopefully will be beneficial.  Patient will see me 2-3 weeks after the epidural and see how patient responds.

## 2017-05-22 ENCOUNTER — Ambulatory Visit
Admission: RE | Admit: 2017-05-22 | Discharge: 2017-05-22 | Disposition: A | Discharge status: Home / Self Care | Payer: 59 | Source: Ambulatory Visit | Attending: Family Medicine | Admitting: Family Medicine

## 2017-05-22 DIAGNOSIS — M5412 Radiculopathy, cervical region: Secondary | ICD-10-CM

## 2017-05-22 MED ORDER — TRIAMCINOLONE ACETONIDE 40 MG/ML IJ SUSP (RADIOLOGY)
60.0000 mg | Freq: Once | INTRAMUSCULAR | Status: AC
  Administered 2017-05-22: 60 mg via EPIDURAL

## 2017-05-22 MED ORDER — IOPAMIDOL (ISOVUE-M 300) INJECTION 61%
1.0000 mL | Freq: Once | INTRAMUSCULAR | Status: AC | PRN
  Administered 2017-05-22: 1 mL via EPIDURAL

## 2017-05-22 NOTE — Discharge Instructions (Signed)

## 2017-06-04 NOTE — Progress Notes (Signed)
[facility] [place], [place] [address] Phone: [phone] Subjective:     CC: Neck pain follow-up  RDE:[id number]  Tyler Clark is a 57 y.o. male coming in with complaint of neck pain follow-up.  Patient is having more of a C7 radicular symptoms.  Started on gabapentin and increase to 400 mg and was on muscle relaxers.  Continues to have difficulty.  Started patient on Effexor.  Still was having difficulty.  MRI was independently visualized by me showing the patient did have mild cervical spinal stenosis with significant congenital as well as acquired.  Moderate to severe neuroforaminal stenosis at left-sided L4 through C7.  Patient attempted epidural and had this done on May 22, 2017.  Patient states doing a little bit better where it is not quite as severe.  Patient will still has pain on a daily basis.  Continue to make improvement but is still not back to regular baseline.     Past Medical History:  Diagnosis Date  . Abdominal pain   . Chest pain   . Colon polyps 08/20/2012   colonoscopy for bloody stools.  . Psoriasis   . Umbilical hernia    Past Surgical History:  Procedure Laterality Date  . CHOLECYSTECTOMY    . COLONOSCOPY W/ POLYPECTOMY  08/16/2012   +colon polyps; repeat in 5 years.  Marland Kitchen KNEE SURGERY    . OPEN ANTERIOR SHOULDER RECONSTRUCTION    . ORTHOPEDIC SURGERY     Social History   Socioeconomic History  . Marital status: Significant Other    Spouse name: None  . Number of children: None  . Years of education: None  . Highest education level: None  Social Needs  . Financial resource strain: None  . Food insecurity - worry: None  . Food insecurity - inability: None  . Transportation needs - medical: None  . Transportation needs - non-medical: None  Occupational History  . None  Tobacco Use  . Smoking status: Current Every Day Smoker    Packs/day: 1.00    Types: Cigarettes  . Smokeless tobacco: Never Used    Substance and Sexual Activity  . Alcohol use: Yes    Alcohol/week: 1.2 oz    Types: 2 Cans of beer per week    Comment: every other day  . Drug use: No  . Sexual activity: None  Other Topics Concern  . None  Social History Narrative  . None   No Known Allergies Family History  Problem Relation Age of Onset  . Diabetes Father   . Colon cancer Neg Hx   . Rectal cancer Neg Hx   . Stomach cancer Neg Hx      Past medical history, social, surgical and family history all reviewed in electronic medical record.  No pertanent information unless stated regarding to the chief complaint.   Review of Systems:Review of systems updated and as accurate as of 06/05/17  No headache, visual changes, nausea, vomiting, diarrhea, constipation, dizziness, abdominal pain, skin rash, fevers, chills, night sweats, weight loss, swollen lymph nodes, body aches, joint swelling, chest pain, shortness of breath, mood changes.  Positive muscle aches  Objective  Blood pressure 130/80, pulse 76, height 5\' 7"  (1.702 m), weight 187 lb (84.8 kg), SpO2 98 %. Systems examined below as of 06/05/17   General: No apparent distress alert and oriented x3 mood and affect normal, dressed appropriately.  HEENT: Pupils equal, extraocular movements intact  Respiratory: Patient's speak in full sentences  and does not appear short of breath  Cardiovascular: No lower extremity edema, non tender, no erythema  Skin: Warm dry intact with no signs of infection or rash on extremities or on axial skeleton.  Abdomen: Soft nontender  Neuro: Cranial nerves II through XII are intact, neurovascularly intact in all extremities with 2+ DTRs and 2+ pulses.  Lymph: No lymphadenopathy of posterior or anterior cervical chain or axillae bilaterally.  Gait normal with good balance and coordination.  MSK:  Non tender with full range of motion and good stability and symmetric strength and tone of shoulders, elbows, wrist, hip, knee and ankles  bilaterally.  Neck: Inspection mild loss of lordosis. No palpable stepoffs. Negative Spurling's maneuver. Mild limitation in range of motion lacking with 5-10 degrees.  This is in all planes Grip strength and sensation normal in bilateral hands Strength good C4 to T1 distribution No sensory change to C4 to T1 Negative Hoffman sign bilaterally Reflexes normal Significant tightness of the trapezius bilaterally   Impression and Recommendations:     This case required medical decision making of moderate complexity.      Note: This dictation was prepared with Dragon dictation along with smaller phrase technology. Any transcriptional errors that result from this process are unintentional.

## 2017-06-05 ENCOUNTER — Other Ambulatory Visit: Payer: Self-pay

## 2017-06-05 ENCOUNTER — Encounter: Payer: Self-pay | Admitting: Family Medicine

## 2017-06-05 ENCOUNTER — Ambulatory Visit (INDEPENDENT_AMBULATORY_CARE_PROVIDER_SITE_OTHER): Payer: 59 | Admitting: Family Medicine

## 2017-06-05 VITALS — BP 130/80 | HR 76 | Ht 67.0 in | Wt 187.0 lb

## 2017-06-05 DIAGNOSIS — M542 Cervicalgia: Secondary | ICD-10-CM | POA: Diagnosis not present

## 2017-06-05 DIAGNOSIS — M5412 Radiculopathy, cervical region: Secondary | ICD-10-CM | POA: Diagnosis not present

## 2017-06-05 MED ORDER — DICLOFENAC SODIUM 2 % TD SOLN: 2.0000 g | Freq: Two times a day (BID) | TRANSDERMAL | 3 refills | Status: DC

## 2017-06-05 NOTE — Patient Instructions (Addendum)
Good to see you  Tyler Clark is your friend.  PT will be calling you as well  Continue the medications  I am glad we are making progress  See me again in 1 month and hopefully we can close this out.

## 2017-06-05 NOTE — Assessment & Plan Note (Signed)
Mild improvement with the epidural.  Continue the Effexor.  We will start with formal physical therapy.  Hopefully this will be beneficial for this.  I believe the patient postconcussive syndrome is resolved at this time.  Gabapentin as well.  Patient will follow up with me again 4 weeks

## 2017-06-14 ENCOUNTER — Ambulatory Visit: Payer: 59 | Attending: Family Medicine | Admitting: Physical Therapy

## 2017-06-14 ENCOUNTER — Other Ambulatory Visit: Payer: Self-pay

## 2017-06-14 ENCOUNTER — Encounter: Payer: Self-pay | Admitting: Physical Therapy

## 2017-06-14 DIAGNOSIS — M545 Low back pain, unspecified: Secondary | ICD-10-CM

## 2017-06-14 DIAGNOSIS — M25552 Pain in left hip: Secondary | ICD-10-CM | POA: Diagnosis present

## 2017-06-14 DIAGNOSIS — R252 Cramp and spasm: Secondary | ICD-10-CM | POA: Diagnosis present

## 2017-06-14 DIAGNOSIS — M542 Cervicalgia: Secondary | ICD-10-CM | POA: Insufficient documentation

## 2017-06-14 DIAGNOSIS — R262 Difficulty in walking, not elsewhere classified: Secondary | ICD-10-CM | POA: Diagnosis present
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
## 2017-06-20 ENCOUNTER — Ambulatory Visit: Payer: 59 | Attending: Family Medicine | Admitting: Physical Therapy

## 2017-06-20 ENCOUNTER — Encounter: Payer: Self-pay | Admitting: Physical Therapy

## 2017-06-20 DIAGNOSIS — M25552 Pain in left hip: Secondary | ICD-10-CM | POA: Insufficient documentation

## 2017-06-20 DIAGNOSIS — R252 Cramp and spasm: Secondary | ICD-10-CM | POA: Diagnosis present

## 2017-06-20 DIAGNOSIS — R262 Difficulty in walking, not elsewhere classified: Secondary | ICD-10-CM | POA: Diagnosis present

## 2017-06-20 DIAGNOSIS — M545 Low back pain, unspecified: Secondary | ICD-10-CM

## 2017-06-20 DIAGNOSIS — M542 Cervicalgia: Secondary | ICD-10-CM | POA: Diagnosis not present

## 2017-06-20 NOTE — Therapy (Signed)
[place], [place], [address] Phone: [phone]   Fax:  [phone]  Physical Therapy Treatment  Patient Details  Name: Tyler Clark MRN: [id number] Date of Birth: August 11, 1960 Referring Provider: Creig Hines   Encounter Date: 06/20/2017  PT End of Session - 06/20/17 0833    Visit Number  2    Date for PT Re-Evaluation  08/14/17    PT Start Time  0758    PT Stop Time  0855    PT Time Calculation (min)  57 min    Activity Tolerance  Patient tolerated treatment well;Patient limited by pain    Behavior During Therapy  [facility] for tasks assessed/performed       Past Medical History:  Diagnosis Date  . Abdominal pain   . Chest pain   . Colon polyps 08/20/2012   colonoscopy for bloody stools.  . Psoriasis   . Umbilical hernia     Past Surgical History:  Procedure Laterality Date  . CHOLECYSTECTOMY    . COLONOSCOPY W/ POLYPECTOMY  08/16/2012   +colon polyps; repeat in 5 years.  Marland Kitchen KNEE SURGERY    . OPEN ANTERIOR SHOULDER RECONSTRUCTION    . ORTHOPEDIC SURGERY      There were no vitals filed for this visit.  Subjective Assessment - 06/20/17 0804    Subjective  "I hurt all day".      Currently in Pain?  Yes    Pain Score  6     Pain Location  Neck    Aggravating Factors   activity                      [facility] Adult PT Treatment/Exercise - 06/20/17 0001      Exercises   Exercises  Neck      Neck Exercises: Machines for Strengthening   UBE (Upper Arm Bike)  Level 2 x 4 minutes    Nustep  level 4 x 6 minutes    Cybex Row  15# 2x10    Lat Pull  15# 2x10      Neck Exercises: Standing   Neck Retraction  15 reps    Other Standing Exercises  5# shrugs with upper trap and levator stretches    Other Standing Exercises  Reviewed HEP, he reports doing and was able to verbalize the exercises      Neck Exercises: Supine   Other Supine Exercise  feet on ball K2C, trunk rotation, small  bridges and isometric abs      Hand Exercises for Cervical Radiculopathy   Towel Squeeze  20 reps each hand    Finger ABduction  15 reps    Other Hand Exercise for Cervical Radiculopathy  passive neural stretch each UE      Moist Heat Therapy   Number Minutes Moist Heat  15 Minutes    Moist Heat Location  Cervical;Lumbar Spine      Electrical Stimulation   Electrical Stimulation Location  C/T and L/S area    Electrical Stimulation Action  premod    Electrical Stimulation Parameters  supine    Electrical Stimulation Goals  Pain               PT Short Term Goals - 06/20/17 0350      PT SHORT TERM GOAL #1   Title  independent with initial HEP    Status  Achieved        PT  Long Term Goals - 06/14/17 1012      PT LONG TERM GOAL #1   Title  decrease pain 50%    Time  8    Period  Weeks    Status  New      PT LONG TERM GOAL #2   Title  increase cervical ROM 25%    Time  8    Period  Weeks    Status  New      PT LONG TERM GOAL #3   Title  increase lumbar ROM 25%    Time  8    Period  Weeks    Status  New      PT LONG TERM GOAL #4   Title  decrease HA frequency 20%    Time  8    Period  Weeks    Status  New      PT LONG TERM GOAL #5   Title  walk without significant antalgic gait x 300 feet    Time  8    Period  Weeks    Status  New            Plan - 06/20/17 0109    Clinical Impression Statement  Patient able to do most exercises with cues for form, he had some dizziness with the upper trap and levator stretch.  He has difficulty getting up and down and transitioning from exercise to exercise due to pain in the back and the hip.    PT Next Visit Plan  continue to try to get him moving in a more natural way    Consulted and Agree with Plan of Care  Patient       Patient will benefit from skilled therapeutic intervention in order to improve the following deficits and impairments:  Abnormal gait, Cardiopulmonary status limiting activity,  Decreased mobility, Decreased strength, Postural dysfunction, Improper body mechanics, Impaired flexibility, Pain, Increased muscle spasms, Decreased range of motion, Difficulty walking  Visit Diagnosis: Cervicalgia  Acute bilateral low back pain without sciatica  Pain in left hip  Difficulty in walking, not elsewhere classified  Cramp and spasm     Problem List Patient Active Problem List   Diagnosis Date Noted  . Headache 04/20/2017  . Cervical radiculopathy at C7 04/20/2017  . Postconcussive syndrome 03/23/2017    Sumner Boast., PT 06/20/2017, 8:35 AM  [place], [place], [address] Phone: [phone]   Fax:  [phone]  Name: Tyler Clark MRN: [id number] Date of Birth: [birth year]

## 2017-06-27 ENCOUNTER — Ambulatory Visit: Payer: 59 | Admitting: Physical Therapy

## 2017-06-27 ENCOUNTER — Encounter: Payer: Self-pay | Admitting: Physical Therapy

## 2017-06-27 DIAGNOSIS — M545 Low back pain, unspecified: Secondary | ICD-10-CM

## 2017-06-27 DIAGNOSIS — M542 Cervicalgia: Secondary | ICD-10-CM

## 2017-06-27 DIAGNOSIS — R262 Difficulty in walking, not elsewhere classified: Secondary | ICD-10-CM

## 2017-06-27 DIAGNOSIS — R252 Cramp and spasm: Secondary | ICD-10-CM

## 2017-06-27 DIAGNOSIS — M25552 Pain in left hip: Secondary | ICD-10-CM

## 2017-06-27 NOTE — Therapy (Signed)
[place], [place], [address] Phone: [phone]   Fax:  [phone]  Physical Therapy Treatment  Patient Details  Name: AARO MEYERS MRN: [id number] Date of Birth: [birth year] Referring Provider: Creig Hines   Encounter Date: 06/27/2017  PT End of Session - 06/27/17 0925    Visit Number  3    Date for PT Re-Evaluation  08/14/17    PT Start Time  0845    PT Stop Time  0944    PT Time Calculation (min)  59 min    Activity Tolerance  Patient tolerated treatment well;Patient limited by pain    Behavior During Therapy  [facility] for tasks assessed/performed       Past Medical History:  Diagnosis Date  . Abdominal pain   . Chest pain   . Colon polyps 08/20/2012   colonoscopy for bloody stools.  . Psoriasis   . Umbilical hernia     Past Surgical History:  Procedure Laterality Date  . CHOLECYSTECTOMY    . COLONOSCOPY W/ POLYPECTOMY  08/16/2012   +colon polyps; repeat in 5 years.  Marland Kitchen KNEE SURGERY    . OPEN ANTERIOR SHOULDER RECONSTRUCTION    . ORTHOPEDIC SURGERY      There were no vitals filed for this visit.  Subjective Assessment - 06/27/17 0849    Subjective  Patient reports that he was no worse after our exercises last week.  Reports cool air makes it worse    Currently in Pain?  Yes    Pain Score  6     Pain Location  Hip neck    Pain Orientation  Left    Pain Descriptors / Indicators  Aching    Aggravating Factors   cold weather, sitting and then trying to get up and move, never less than a 6/10                      [facility] Adult PT Treatment/Exercise - 06/27/17 0001      Neck Exercises: Machines for Strengthening   UBE (Upper Arm Bike)  Level 2 x 4 minutes    Nustep  level 4 x 6 minutes    Cybex Row  15# 2x10    Lat Pull  15# 2x10    Other Machines for Strengthening  leg press 20# 2x10      Neck Exercises: Standing   Neck Retraction  15 reps    Other Standing  Exercises  5# shrugs with upper trap and levator stretches      Neck Exercises: Supine   Neck Retraction  20 reps    Cervical Rotation  20 reps    X to V  20 reps;Weight    X to V Weights (lbs)  4#    Other Supine Exercise  feet on ball K2C, trunk rotation, small bridges and isometric abs    Other Supine Exercise  Ball b/n knees squeeze, red tband hip abduction      Moist Heat Therapy   Number Minutes Moist Heat  15 Minutes    Moist Heat Location  Cervical;Lumbar Spine      Electrical Stimulation   Electrical Stimulation Location  C/T and L/S area    Electrical Stimulation Action  premod    Electrical Stimulation Parameters  supine    Electrical Stimulation Goals  Pain               PT Short Term Goals -  06/20/17 0835      PT SHORT TERM GOAL #1   Title  independent with initial HEP    Status  Achieved        PT Long Term Goals - 06/27/17 0927      PT LONG TERM GOAL #1   Title  decrease pain 50%    Status  On-going      PT LONG TERM GOAL #4   Title  decrease HA frequency 20%    Status  On-going            Plan - 06/27/17 0926    Clinical Impression Statement  Patient continues to hvae significant pain with activity.  He is moving a little better but still gaurded with motions especially when gettin gup from sitting.  Needed cues for posture during exercises    PT Next Visit Plan  continue to try to get him moving in a more natural way adding exercises as tolerated    Consulted and Agree with Plan of Care  Patient       Patient will benefit from skilled therapeutic intervention in order to improve the following deficits and impairments:  Abnormal gait, Cardiopulmonary status limiting activity, Decreased mobility, Decreased strength, Postural dysfunction, Improper body mechanics, Impaired flexibility, Pain, Increased muscle spasms, Decreased range of motion, Difficulty walking  Visit Diagnosis: Cervicalgia  Acute bilateral low back pain without  sciatica  Pain in left hip  Difficulty in walking, not elsewhere classified  Cramp and spasm     Problem List Patient Active Problem List   Diagnosis Date Noted  . Headache 04/20/2017  . Cervical radiculopathy at C7 04/20/2017  . Postconcussive syndrome 03/23/2017    Sumner Boast., PT 06/27/2017, 9:28 AM  [facility] [place], [place], [address] Phone: [phone]   Fax:  [phone]  Name: DESTINE AMBROISE MRN: [id number] Date of Birth: 14-Jun-1960

## 2017-07-04 ENCOUNTER — Encounter: Payer: Self-pay | Admitting: Physical Therapy

## 2017-07-04 ENCOUNTER — Ambulatory Visit: Payer: 59 | Admitting: Physical Therapy

## 2017-07-04 DIAGNOSIS — M545 Low back pain, unspecified: Secondary | ICD-10-CM

## 2017-07-04 DIAGNOSIS — M542 Cervicalgia: Secondary | ICD-10-CM

## 2017-07-04 DIAGNOSIS — M25552 Pain in left hip: Secondary | ICD-10-CM

## 2017-07-04 DIAGNOSIS — R262 Difficulty in walking, not elsewhere classified: Secondary | ICD-10-CM

## 2017-07-04 DIAGNOSIS — R252 Cramp and spasm: Secondary | ICD-10-CM
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
## 2017-07-04 NOTE — Therapy (Signed)
[facility] [place], [place], [address] Phone: [phone]   Fax:  [phone]  Physical Therapy Treatment  Patient Details  Name: Tyler Clark MRN: [id number] Date of Birth: 10/04/1960 Referring Provider: Creig Hines   Encounter Date: 07/04/2017  PT End of Session - 07/04/17 1010    Visit Number  4    Date for PT Re-Evaluation  08/14/17    PT Start Time  0832    PT Stop Time  0930    PT Time Calculation (min)  58 min    Activity Tolerance  Patient tolerated treatment well;Patient limited by pain    Behavior During Therapy  [facility] for tasks assessed/performed       Past Medical History:  Diagnosis Date  . Abdominal pain   . Chest pain   . Colon polyps 08/20/2012   colonoscopy for bloody stools.  . Psoriasis   . Umbilical hernia     Past Surgical History:  Procedure Laterality Date  . CHOLECYSTECTOMY    . COLONOSCOPY W/ POLYPECTOMY  08/16/2012   +colon polyps; repeat in 5 years.  Marland Kitchen KNEE SURGERY    . OPEN ANTERIOR SHOULDER RECONSTRUCTION    . ORTHOPEDIC SURGERY      There were no vitals filed for this visit.  Subjective Assessment - 07/04/17 0835    Subjective  Patient continues to have c/o pain, he is moving maybe a little better.  He reports that the cold weather makes the hip worse, he also c/o pain in the mid scapular area    Currently in Pain?  Yes    Pain Score  5     Pain Location  Hip neck    Pain Orientation  Left    Aggravating Factors   cold weather    Pain Relieving Factors  rest, heat                      [facility] Adult PT Treatment/Exercise - 07/04/17 0001      Neck Exercises: Machines for Strengthening   UBE (Upper Arm Bike)  Level 2 x 4 minutes    Nustep  level 5 x 6 minutes    Cybex Row  15# 2x10    Lat Pull  15# 2x10    Other Machines for Strengthening  leg press 20# 2x10    Other Machines for Strengthening  leg extension 5#, leg curls 20# 2x10      Neck  Exercises: Standing   Neck Retraction  15 reps    Other Standing Exercises  5# shrugs with upper trap and levator stretches      Neck Exercises: Supine   Cervical Rotation  20 reps    Other Supine Exercise  feet on ball K2C, trunk rotation, small bridges and isometric abs    Other Supine Exercise  Ball b/n knees squeeze, red tband hip abduction      Moist Heat Therapy   Number Minutes Moist Heat  15 Minutes    Moist Heat Location  Cervical;Lumbar Spine      Electrical Stimulation   Electrical Stimulation Location  C/T and L/S area    Electrical Stimulation Action  premod    Electrical Stimulation Parameters  supine    Electrical Stimulation Goals  Pain               PT Short Term Goals - 06/20/17 0835      PT SHORT TERM GOAL #  1   Title  independent with initial HEP    Status  Achieved        PT Long Term Goals - 07/04/17 1012      PT LONG TERM GOAL #1   Title  decrease pain 50%    Status  On-going      PT LONG TERM GOAL #2   Title  increase cervical ROM 25%    Status  On-going      PT LONG TERM GOAL #3   Title  increase lumbar ROM 25%    Status  On-going      PT LONG TERM GOAL #4   Title  decrease HA frequency 20%    Status  On-going      PT LONG TERM GOAL #5   Title  walk without significant antalgic gait x 300 feet    Status  On-going            Plan - 07/04/17 1011    Clinical Impression Statement  Patient continues to have pain and moves slow and gaurded, he tends to limp significantly when he gets up from sitting.  I have seen him 5 times and feel that he moves a little better but still having difficulty.    PT Next Visit Plan  he will be seeing MD tomorrow.  May continue to work on progression unless MD feels otherwise since he is still hurting    Consulted and Agree with Plan of Care  Patient       Patient will benefit from skilled therapeutic intervention in order to improve the following deficits and impairments:  Abnormal gait,  Cardiopulmonary status limiting activity, Decreased mobility, Decreased strength, Postural dysfunction, Improper body mechanics, Impaired flexibility, Pain, Increased muscle spasms, Decreased range of motion, Difficulty walking  Visit Diagnosis: Cervicalgia  Acute bilateral low back pain without sciatica  Pain in left hip  Difficulty in walking, not elsewhere classified  Cramp and spasm     Problem List Patient Active Problem List   Diagnosis Date Noted  . Headache 04/20/2017  . Cervical radiculopathy at C7 04/20/2017  . Postconcussive syndrome 03/23/2017    Sumner Boast., PT 07/04/2017, 10:13 AM  [facility] [place], [place], [address] Phone: [phone]   Fax:  [phone]  Name: Tyler Clark MRN: [id number] Date of Birth: [birth year]

## 2017-07-05 ENCOUNTER — Ambulatory Visit (INDEPENDENT_AMBULATORY_CARE_PROVIDER_SITE_OTHER): Payer: 59 | Admitting: Family Medicine

## 2017-07-05 ENCOUNTER — Encounter: Payer: Self-pay | Admitting: Family Medicine

## 2017-07-05 ENCOUNTER — Other Ambulatory Visit: Payer: Self-pay

## 2017-07-05 DIAGNOSIS — F0781 Postconcussional syndrome: Secondary | ICD-10-CM | POA: Diagnosis not present

## 2017-07-05 DIAGNOSIS — M5412 Radiculopathy, cervical region: Secondary | ICD-10-CM | POA: Diagnosis not present

## 2017-07-05 DIAGNOSIS — M25512 Pain in left shoulder: Secondary | ICD-10-CM | POA: Diagnosis not present

## 2017-07-05 MED ORDER — DICLOFENAC SODIUM 2 % TD SOLN: 2.0000 g | Freq: Two times a day (BID) | TRANSDERMAL | 3 refills | Status: DC

## 2017-07-05 NOTE — Assessment & Plan Note (Signed)
I believe that resolved at this time

## 2017-07-05 NOTE — Patient Instructions (Signed)
Good to see you  Tyler Clark is your friend.  I think you can do the exercises on your own  Tried trigger point injections today  If not better we can try another epidural and if so write me in 2 weeks Make an appointment in 4 weeks just in case but if oin well you can cancel and see me when you need me

## 2017-07-05 NOTE — Assessment & Plan Note (Signed)
Improved of the epidural.  Attempted trigger point injections today.  Discussed icing regimen discussed which activities of doing which wants to avoid.  Increase activity as tolerated.  Follow-up with me again in 6 weeks if pain is not completely resolved.  I do believe that patient is likely though near medical maximal improvement.  Patient has no restrictions at work.  Continue medications at this time.

## 2017-07-05 NOTE — Assessment & Plan Note (Signed)
Problem.  Hopefully this means patient is making some improvement.  Discussed icing regimen and home exercises.  Discussed which activities of doing which wants to avoid.  Increase activity slowly over the course the next several days.  Discussed posture and ergonomics.  Patient will finish up with physical therapy.  See me again in 6-8 weeks if not completely resolved.

## 2017-08-02 ENCOUNTER — Ambulatory Visit: Payer: 59 | Admitting: Family Medicine

## 2017-08-02 DIAGNOSIS — Z2089 Contact with and (suspected) exposure to other communicable diseases: Secondary | ICD-10-CM

## 2017-09-22 ENCOUNTER — Telehealth: Payer: Self-pay | Admitting: Family Medicine

## 2017-09-22 NOTE — Telephone Encounter (Signed)
Copied from [place] [id number]. Topic: Inquiry >> Sep 22, 2017 11:45 AM Conception Chancy, NT wrote: Reason for CRM: Lovena Le is calling from [facility] and states she did not receive the office notes from 07/05/17. She is going to need the bill as well for 07/05/17. Also is requesting MRI report. Please fax them at [phone]   Cb# [phone] >> Sep 22, 2017 12:16 PM Marin Olp L wrote: Patient was referred to PT and Coralyn Mark needs to know if he was seen there and where it was?
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
## 2018-02-17 IMAGING — XA DG INJECT/[PERSON_NAME] INC NEEDLE/CATH/PLC EPI/CERV/THOR W/IMG
2 series · 2 of 2 positions shown · non-contrast
Comparison: none

CLINICAL DATA: Cervical radiculopathy. Displacement of the cervical
disc at C5-6 and C6-7.

[Series 2: ortho standard · 1 of 1 slices shown (1 of 2)]
[im 1/1]
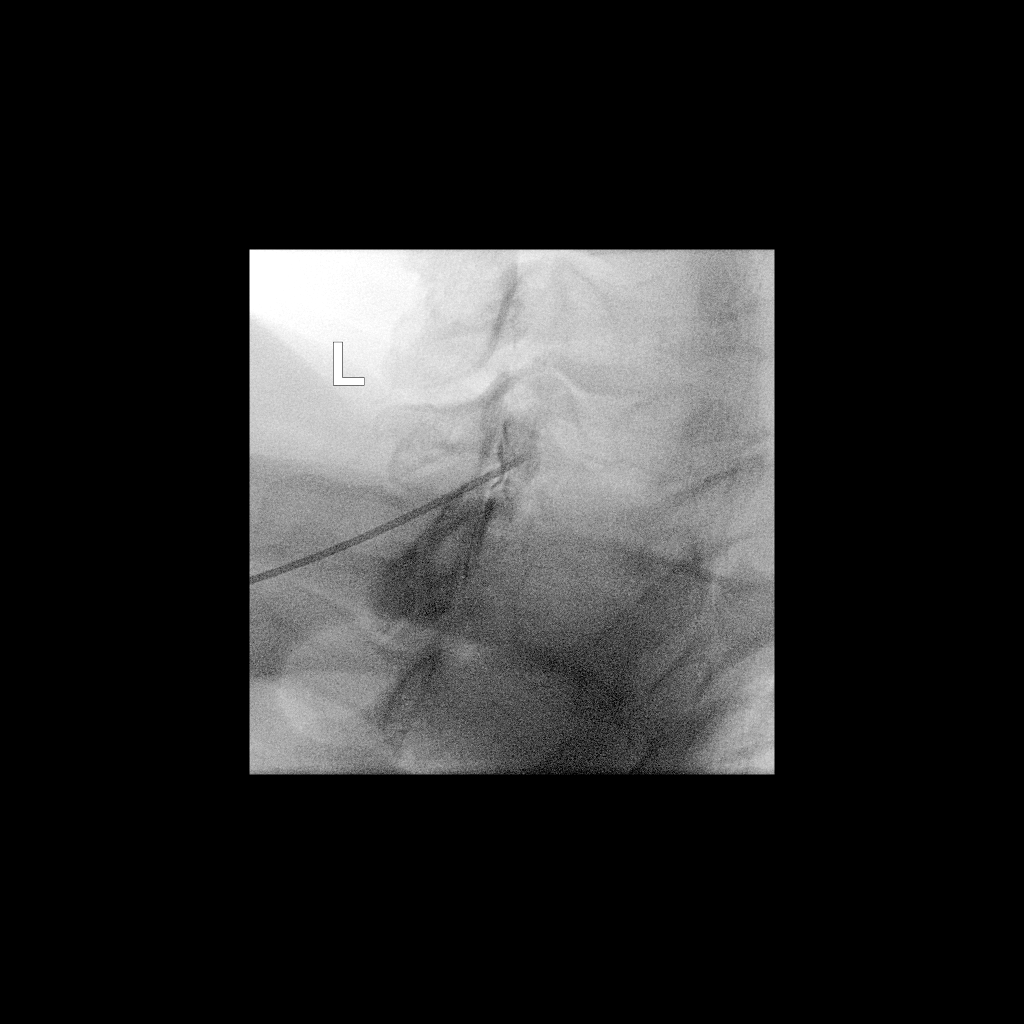

[Series 3: ortho standard · 1 of 1 slices shown (2 of 2)]
[im 1/1]
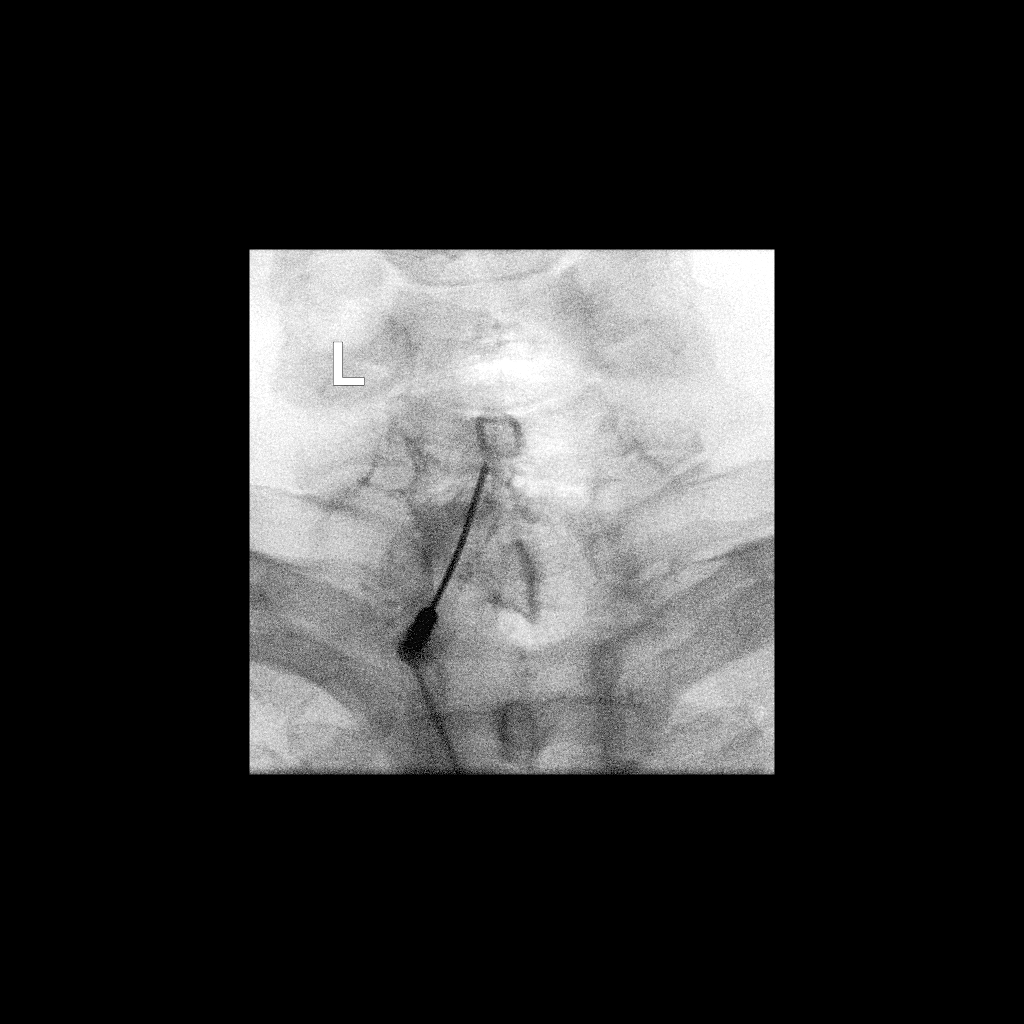

[2 of 2 positions shown; findings below may reference images not displayed]

FLUOROSCOPY TIME:  Radiation Exposure Index (as provided by the
fluoroscopic device): 11.67 uGy*m2

Fluoroscopy Time:  29 seconds

Number of Acquired Images:  0

PROCEDURE:
CERVICAL EPIDURAL INJECTION

An interlaminar approach was performed on the left at C7-T1. A 20
gauge epidural needle was advanced using loss-of-resistance
technique.

DIAGNOSTIC EPIDURAL INJECTION

Injection of Isovue-M 300 shows a good epidural pattern with spread
above and below the level of needle placement, primarily on the
left. No vascular opacification is seen. THERAPEUTIC

EPIDURAL INJECTION

1.5 ml of Kenalog 40 mixed with 1 ml of 1% Lidocaine and 2 ml of
normal saline were then instilled. The procedure was well-tolerated,
and the patient was discharged thirty minutes following the
injection in good condition.
IMPRESSION: Technically successful first epidural injection on the left at
C7-T1.

## 2019-05-09 ENCOUNTER — Emergency Department (HOSPITAL_COMMUNITY)
Admission: EM | Admit: 2019-05-09 | Discharge: 2019-05-09 | Disposition: A | Discharge status: Home / Self Care | Payer: 59 | Attending: Emergency Medicine | Admitting: Emergency Medicine

## 2019-05-09 ENCOUNTER — Other Ambulatory Visit: Payer: Self-pay

## 2019-05-09 ENCOUNTER — Encounter (HOSPITAL_COMMUNITY): Payer: Self-pay

## 2019-05-09 DIAGNOSIS — F17210 Nicotine dependence, cigarettes, uncomplicated: Secondary | ICD-10-CM | POA: Insufficient documentation

## 2019-05-09 DIAGNOSIS — Y939 Activity, unspecified: Secondary | ICD-10-CM | POA: Diagnosis not present

## 2019-05-09 DIAGNOSIS — S4992XA Unspecified injury of left shoulder and upper arm, initial encounter: Secondary | ICD-10-CM | POA: Diagnosis present

## 2019-05-09 DIAGNOSIS — Z79899 Other long term (current) drug therapy: Secondary | ICD-10-CM | POA: Diagnosis not present

## 2019-05-09 DIAGNOSIS — S46912A Strain of unspecified muscle, fascia and tendon at shoulder and upper arm level, left arm, initial encounter: Secondary | ICD-10-CM | POA: Insufficient documentation

## 2019-05-09 DIAGNOSIS — Y990 Civilian activity done for income or pay: Secondary | ICD-10-CM | POA: Insufficient documentation

## 2019-05-09 DIAGNOSIS — X503XXA Overexertion from repetitive movements, initial encounter: Secondary | ICD-10-CM | POA: Diagnosis not present

## 2019-05-09 DIAGNOSIS — Y9259 Other trade areas as the place of occurrence of the external cause: Secondary | ICD-10-CM | POA: Diagnosis not present

## 2019-05-09 MED ORDER — DICLOFENAC SODIUM 75 MG PO TBEC: 75.0000 mg | DELAYED_RELEASE_TABLET | Freq: Two times a day (BID) | ORAL | 0 refills | Status: DC

## 2019-05-09 NOTE — ED Provider Notes (Signed)
[facility] DEPT Provider Note   CSN: [id number] Arrival date & time: 05/09/19  1405     History Chief Complaint  Patient presents with  . Shoulder Pain    MOUA BROSKI is a 59 y.o. male.  The history is provided by the patient. No language interpreter was used.  Shoulder Pain Location:  Shoulder Shoulder location:  L shoulder Injury: no   Pain details:    Quality:  Aching   Radiates to:  Does not radiate   Severity:  Moderate   Onset quality:  Gradual   Timing:  Constant Dislocation: no   Prior injury to area:  Yes Relieved by:  Nothing Worsened by:  Nothing Ineffective treatments:  None tried Associated symptoms: decreased range of motion   Associated symptoms: no back pain    Pt reports he lifts a lot at work. Pt reports no specific injury      Past Medical History:  Diagnosis Date  . Abdominal pain   . Chest pain   . Colon polyps 08/20/2012   colonoscopy for bloody stools.  . Psoriasis   . Umbilical hernia     Patient Active Problem List   Diagnosis Date Noted  . Trigger point of left shoulder region 07/05/2017  . Headache 04/20/2017  . Cervical radiculopathy at C7 04/20/2017  . Postconcussive syndrome 03/23/2017    Past Surgical History:  Procedure Laterality Date  . CHOLECYSTECTOMY    . COLONOSCOPY W/ POLYPECTOMY  08/16/2012   +colon polyps; repeat in 5 years.  Marland Kitchen KNEE SURGERY    . OPEN ANTERIOR SHOULDER RECONSTRUCTION    . ORTHOPEDIC SURGERY         Family History  Problem Relation Age of Onset  . Diabetes Father   . Colon cancer Neg Hx   . Rectal cancer Neg Hx   . Stomach cancer Neg Hx     Social History   Tobacco Use  . Smoking status: Current Every Day Smoker    Packs/day: 1.00    Types: Cigarettes  . Smokeless tobacco: Never Used  Substance Use Topics  . Alcohol use: Yes    Alcohol/week: 2.0 standard drinks    Types: 2 Cans of beer per week    Comment: every other day  . Drug use: No     Home Medications Prior to Admission medications   Medication Sig Start Date End Date Taking? Authorizing Provider  albuterol (PROVENTIL HFA;VENTOLIN HFA) 108 (90 Base) MCG/ACT inhaler Inhale 2 puffs into the lungs every 4 (four) hours as needed for wheezing or shortness of breath. 05/01/16   Fransico Meadow, PA-C  azithromycin (ZITHROMAX) 250 MG tablet Take 1 tablet (250 mg total) by mouth daily. Take first 2 tablets together, then 1 every day until finished. 05/01/16   Fransico Meadow, PA-C  bacitracin ointment Apply 1 application topically 2 (two) times daily. 12/20/16   Waynetta Pean, PA-C  diclofenac (VOLTAREN) 75 MG EC tablet Take 1 tablet (75 mg total) by mouth 2 (two) times daily. 05/09/19   Fransico Meadow, PA-C  Diclofenac Sodium (PENNSAID) 2 % SOLN Place 2 g onto the skin 2 (two) times daily. 06/05/17   Lyndal Pulley, DO  Diclofenac Sodium (PENNSAID) 2 % SOLN Place 2 g onto the skin 2 (two) times daily. 07/05/17   Lyndal Pulley, DO  gabapentin (NEURONTIN) 400 MG capsule Take 1 capsule (400 mg total) by mouth at bedtime. 04/20/17   Lyndal Pulley, DO  ibuprofen (  ADVIL,MOTRIN) 600 MG tablet Take 1 tablet (600 mg total) every 6 (six) hours as needed by mouth. 03/06/17   Jacqlyn Larsen, PA-C  traZODone (DESYREL) 50 MG tablet Take 1-2 tablets (50-100 mg total) by mouth at bedtime as needed for sleep. 04/06/17   Lyndal Pulley, DO  venlafaxine XR (EFFEXOR XR) 37.5 MG 24 hr capsule Take 1 capsule (37.5 mg total) by mouth daily with breakfast. 04/20/17   Lyndal Pulley, DO    Allergies    Patient has no known allergies.  Review of Systems   Review of Systems  Musculoskeletal: Negative for back pain.  All other systems reviewed and are negative.   Physical Exam Updated Vital Signs BP (!) 153/96   Pulse 83   Temp 98.4 F (36.9 C)   Resp 16   SpO2 100%   Physical Exam Vitals and nursing note reviewed.  Constitutional:      Appearance: He is well-developed.  HENT:      Head: Normocephalic and atraumatic.  Eyes:     Conjunctiva/sclera: Conjunctivae normal.  Cardiovascular:     Rate and Rhythm: Normal rate and regular rhythm.     Heart sounds: No murmur.  Pulmonary:     Effort: Pulmonary effort is normal. No respiratory distress.     Breath sounds: Normal breath sounds.  Abdominal:     Tenderness: There is no abdominal tenderness.  Musculoskeletal:        General: No swelling or tenderness.     Cervical back: Neck supple.     Comments: Pain with movement   Skin:    General: Skin is warm and dry.  Neurological:     Mental Status: He is alert.  Psychiatric:        Mood and Affect: Mood normal.     ED Results / Procedures / Treatments   Labs (all labs ordered are listed, but only abnormal results are displayed) Labs Reviewed - No data to display  EKG None  Radiology No results found.  Procedures Procedures (including critical care time)  Medications Ordered in ED Medications - No data to display  ED Course  I have reviewed the triage vital signs and the nursing notes.  Pertinent labs & imaging results that were available during my care of the patient were reviewed by me and considered in my medical decision making (see chart for details).    MDM Rules/Calculators/A&P                      MDM  Pt counseled on proabale tendonitis vs muscle strain.  Pt given rx for voltaren and placed in a sling Pt advised to follow up with Dr. Ninfa Linden for evaluation  Final Clinical Impression(s) / ED Diagnoses Final diagnoses:  Strain of left shoulder, initial encounter    Rx / DC Orders ED Discharge Orders         Ordered    diclofenac (VOLTAREN) 75 MG EC tablet  2 times daily     05/09/19 1515        An After Visit Summary was printed and given to the patient.    Fransico Meadow, [place] 05/09/19 1522    Lucrezia Starch, MD 05/10/19 [phone]

## 2019-05-09 NOTE — ED Triage Notes (Signed)
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 Pt has aggravated left shoulder and it has been bothering him for 3 days. Pt is able to move it, but is concerned for tears.

## 2019-05-09 NOTE — ED Notes (Signed)
An After Visit Summary was printed and given to the patient. Discharge instructions given and no further questions at this time.  

## 2019-05-15 ENCOUNTER — Other Ambulatory Visit: Payer: Self-pay

## 2019-05-15 ENCOUNTER — Ambulatory Visit (INDEPENDENT_AMBULATORY_CARE_PROVIDER_SITE_OTHER): Payer: 59 | Admitting: Orthopaedic Surgery

## 2019-05-15 ENCOUNTER — Encounter: Payer: Self-pay | Admitting: Orthopaedic Surgery

## 2019-05-15 ENCOUNTER — Ambulatory Visit: Payer: Self-pay

## 2019-05-15 DIAGNOSIS — M25512 Pain in left shoulder: Secondary | ICD-10-CM

## 2019-05-15 DIAGNOSIS — G8929 Other chronic pain: Secondary | ICD-10-CM | POA: Diagnosis not present

## 2019-05-15 MED ORDER — METHYLPREDNISOLONE ACETATE 40 MG/ML IJ SUSP
40.0000 mg | INTRAMUSCULAR | Status: AC | PRN
  Administered 2019-05-15: 17:00:00 40 mg via INTRA_ARTICULAR

## 2019-05-15 MED ORDER — LIDOCAINE HCL 1 % IJ SOLN
3.0000 mL | INTRAMUSCULAR | Status: AC | PRN
  Administered 2019-05-15: 17:00:00 3 mL

## 2019-05-15 NOTE — Progress Notes (Signed)
Office Visit Note   Patient: Tyler Clark           Date of Birth: Nov 02, 1960           MRN: [id number] Visit Date: 05/15/2019              Requested by: No referring provider defined for this encounter. PCP: System, Pcp Not In   Assessment & Plan: Visit Diagnoses:  1. Acute pain of left shoulder     Plan: Tyler Clark is shown pendulum Codman, wall crawls and forward flexion exercises.  See him back in 2 weeks to see what type of response he had to the injection.  He continues to have pain in the shoulder may consider MRI to rule out rotator cuff tear.  Questions encouraged and answered at length.  Follow-Up Instructions: Return in about 2 weeks (around 05/29/2019).   Orders:  Orders Placed This Encounter  Procedures  . Large Joint Inj  . XR Shoulder Left   No orders of the defined types were placed in this encounter.     Procedures: Large Joint Inj: L subacromial bursa on 05/15/2019 4:43 PM Indications: pain Details: 22 G 1.5 in needle, superior approach  Arthrogram: No  Medications: 3 mL lidocaine 1 %; 40 mg methylPREDNISolone acetate 40 MG/ML Outcome: tolerated well, no immediate complications Procedure, treatment alternatives, risks and benefits explained, specific risks discussed. Consent was given by the patient. Immediately prior to procedure a time out was called to verify the correct patient, procedure, equipment, support staff and site/side marked as required. Patient was prepped and draped in the usual sterile fashion.       Clinical Data: No additional findings.   Subjective: Chief Complaint  Patient presents with  . Left Shoulder - Pain    HPI Tyler Clark 58 year old male comes in today with left shoulder pain that started on 05/06/2019.  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  He notes decreased range of motion of the shoulder and decreased strength.  Denies any numbness tingling down the left  arm.  Review of Systems Negative for fevers chills.  Objective: Vital Signs: There were no vitals taken for this visit.  Physical Exam Constitutional:      Appearance: He is not ill-appearing or diaphoretic.  Pulmonary:     Effort: Pulmonary effort is normal.  Neurological:     Mental Status: He is alert and oriented to person, place, and time.  Psychiatric:        Mood and Affect: Mood normal.        Behavior: Behavior normal.     Ortho Exam Left shoulder: Active forward flexion to approximately 140 degrees: Passively  170 degrees.  5 out of 5 strength external and internal rotation against resistance bilateral shoulders.  Impingement testing positive on the left.  Tenderness near the origin triceps.  5 out of 5 strength throughout bilaterally triceps against resistance also biceps 5 out of 5 strength bilaterally against resistance no obvious biceps tendon rupture.  Distal biceps tendon intact and nontender.  He is nontender over the bicep muscle belly.  Specialty Comments:  No specialty comments available.  Imaging: XR Shoulder Left  Result Date: 05/15/2019 Left shoulder 3 views: No acute fractures.  Shoulder is well located.  Glenohumeral joint is well-maintained.  No significant arthritic changes.    PMFS History: Patient Active Problem List   Diagnosis Date Noted  . Trigger point of  left shoulder region 07/05/2017  . Headache 04/20/2017  . Cervical radiculopathy at C7 04/20/2017  . Postconcussive syndrome 03/23/2017   Past Medical History:  Diagnosis Date  . Abdominal pain   . Chest pain   . Colon polyps 08/20/2012   colonoscopy for bloody stools.  . Psoriasis   . Umbilical hernia     Family History  Problem Relation Age of Onset  . Diabetes Father   . Colon cancer Neg Hx   . Rectal cancer Neg Hx   . Stomach cancer Neg Hx     Past Surgical History:  Procedure Laterality Date  . CHOLECYSTECTOMY    . COLONOSCOPY W/ POLYPECTOMY  08/16/2012   +colon polyps;  repeat in 5 years.  Marland Kitchen KNEE SURGERY    . OPEN ANTERIOR SHOULDER RECONSTRUCTION    . ORTHOPEDIC SURGERY     Social History   Occupational History  . Not on file  Tobacco Use  . Smoking status: Current Every Day Smoker    Packs/day: 1.00    Types: Cigarettes  . Smokeless tobacco: Never Used  Substance and Sexual Activity  . Alcohol use: Yes    Alcohol/week: 2.0 standard drinks    Types: 2 Cans of beer per week    Comment: every other day  . Drug use: No  . Sexual activity: Not on file

## 2019-05-29 ENCOUNTER — Ambulatory Visit (INDEPENDENT_AMBULATORY_CARE_PROVIDER_SITE_OTHER): Payer: 59 | Admitting: Orthopaedic Surgery

## 2019-05-29 ENCOUNTER — Other Ambulatory Visit: Payer: Self-pay

## 2019-05-29 ENCOUNTER — Encounter: Payer: Self-pay | Admitting: Orthopaedic Surgery

## 2019-05-29 DIAGNOSIS — M25512 Pain in left shoulder: Secondary | ICD-10-CM

## 2019-05-29 NOTE — Progress Notes (Signed)
Office Visit Note   Patient: Tyler Clark           Date of Birth: 03/12/1961           MRN: [id number] Visit Date: 05/29/2019              Requested by: No referring provider defined for this encounter. PCP: System, Pcp Not In   Assessment & Plan: Visit Diagnoses:  1. Acute pain of left shoulder     Plan: Given patient's positive findings on examination today and continued pain despite conservative treatment recommend MRI of the left shoulder rule out rotator cuff tear.  He will follow-up after the MRI to go over results and discuss further treatment.  Questions encouraged and answered.  Follow-Up Instructions: Return After MRI.   Orders:  No orders of the defined types were placed in this encounter.  No orders of the defined types were placed in this encounter.     Procedures: No procedures performed   Clinical Data: No additional findings.   Subjective: Chief Complaint  Patient presents with  . Left Shoulder - Follow-up    HPI Tyler Clark returns today follow-up of his left shoulder status post subacromial injection on 05/15/2019.  He dates that the shoulder injection may have eased the pain but he still having significant pain and discomfort especially with his work that he does that is quite physical.  States the arm feels very weak.  He is having no significant numbness tingling down the arm however he does today state that he has had occasional which has been ongoing prior to the acute injury of his left shoulder. Review of Systems Negative for fevers chills shortness of breath Objective: Vital Signs: There were no vitals taken for this visit.  Physical Exam Constitutional:      Appearance: He is normal weight. He is not ill-appearing or diaphoretic.  Pulmonary:     Effort: Pulmonary effort is normal.  Neurological:     Mental Status: He is alert and oriented to person, place, and time.  Psychiatric:        Mood and Affect: Mood normal.     Ortho  Exam Bilateral shoulders: External rotation weakness on the left 5 out of 5 strength on the right.  Internal rotation 5 out of 5 bilaterally.  Positive impingement testing on the left.  Positive liftoff test on the left negative on the right.  Specialty Comments:  No specialty comments available.  Imaging: No results found.   PMFS History: Patient Active Problem List   Diagnosis Date Noted  . Trigger point of left shoulder region 07/05/2017  . Headache 04/20/2017  . Cervical radiculopathy at C7 04/20/2017  . Postconcussive syndrome 03/23/2017   Past Medical History:  Diagnosis Date  . Abdominal pain   . Chest pain   . Colon polyps 08/20/2012   colonoscopy for bloody stools.  . Psoriasis   . Umbilical hernia     Family History  Problem Relation Age of Onset  . Diabetes Father   . Colon cancer Neg Hx   . Rectal cancer Neg Hx   . Stomach cancer Neg Hx     Past Surgical History:  Procedure Laterality Date  . CHOLECYSTECTOMY    . COLONOSCOPY W/ POLYPECTOMY  08/16/2012   +colon polyps; repeat in 5 years.  Marland Kitchen KNEE SURGERY    . OPEN ANTERIOR SHOULDER RECONSTRUCTION    . ORTHOPEDIC SURGERY     Social History   Occupational History  .  Not on file  Tobacco Use  . Smoking status: Current Every Day Smoker    Packs/day: 1.00    Types: Cigarettes  . Smokeless tobacco: Never Used  Substance and Sexual Activity  . Alcohol use: Yes    Alcohol/week: 2.0 standard drinks    Types: 2 Cans of beer per week    Comment: every other day  . Drug use: No  . Sexual activity: Not on file

## 2019-05-29 NOTE — Addendum Note (Signed)
Addended by: Michae Kava B on: 05/29/2019 09:15 AM   Modules accepted: Orders

## 2019-06-04 ENCOUNTER — Telehealth: Payer: Self-pay | Admitting: *Deleted

## 2019-06-04 NOTE — Telephone Encounter (Signed)
Pt called left vm inquiring about his MRI and wanted to know when he will be scheduled. I sent message over to Vernon Prey with [facility] asking what the status will be and if can get scheduled soon.

## 2019-06-08 ENCOUNTER — Ambulatory Visit
Admission: RE | Admit: 2019-06-08 | Discharge: 2019-06-08 | Disposition: A | Discharge status: Home / Self Care | Payer: 59 | Source: Ambulatory Visit | Attending: Orthopaedic Surgery | Admitting: Orthopaedic Surgery

## 2019-06-08 ENCOUNTER — Other Ambulatory Visit: Payer: Self-pay

## 2019-06-08 DIAGNOSIS — M25512 Pain in left shoulder: Secondary | ICD-10-CM

## 2019-06-17 ENCOUNTER — Other Ambulatory Visit: Payer: Self-pay

## 2019-06-17 ENCOUNTER — Ambulatory Visit (INDEPENDENT_AMBULATORY_CARE_PROVIDER_SITE_OTHER): Payer: 59 | Admitting: Orthopaedic Surgery

## 2019-06-17 ENCOUNTER — Encounter: Payer: Self-pay | Admitting: Orthopaedic Surgery

## 2019-06-17 DIAGNOSIS — M25512 Pain in left shoulder: Secondary | ICD-10-CM | POA: Diagnosis not present

## 2019-06-17 DIAGNOSIS — M7542 Impingement syndrome of left shoulder: Secondary | ICD-10-CM

## 2019-06-17 NOTE — Progress Notes (Signed)
The patient comes in today to go over an MRI of his left shoulder.  He had an acute injury to the left shoulder at work lifting something heavy.  He had failed steroid injection.  He is back to work but still reporting shoulder pain with overhead activities and lifting in general.  The MRI is reviewed with him and it does show quite significant arthritis at the [facility] joint.  There is edema and fluid in the [facility] joint and the glenohumeral joint.  There is advanced for age arthritic changes in the glenohumeral joint as well as a anterior and posterior labral tear with a paralabral cyst.  The rotator cuff and the biceps tendon are intact.  There is no significant fluid in the subacromial or subdeltoid space.  On exam he still has significant pain at the [facility] joint and subacromial outlet.  There is some limited overhead motion of that left shoulder.  I did go over the MRI with him in a shoulder model.  I do feel that he would benefit from a left shoulder arthroscopy with subacromial decompression and extensive debridement.  This would include debriding the labrum and the paralabral cyst as well as a distal clavicle resection arthroscopically.  We would then see him back at 1 week postoperative.  I would like to let him get back to work in a few days if he would like to but with avoiding overhead lifting and no significant lifting with his left shoulder.  I explained what the interoperative and postoperative course involves.  We talked in detail about the risk and benefits of surgery.  This is something he is interested in scheduling.  He is a smoker and has a history of asthma.  This will be done under general anesthesia and a shoulder block.  If there is any concerns about his asthma with him needing to potentially stay overnight we would certainly do so if it is warranted postoperatively.  All question concerns were answered and addressed.  He is interested in having this scheduled.

## 2019-06-24 ENCOUNTER — Other Ambulatory Visit: Payer: Self-pay

## 2019-06-26 ENCOUNTER — Other Ambulatory Visit: Payer: Self-pay

## 2019-06-26 ENCOUNTER — Encounter (HOSPITAL_BASED_OUTPATIENT_CLINIC_OR_DEPARTMENT_OTHER): Payer: Self-pay | Admitting: Orthopaedic Surgery

## 2019-07-01 ENCOUNTER — Inpatient Hospital Stay (HOSPITAL_COMMUNITY)
Admission: RE | Admit: 2019-07-01 | Discharge: 2019-07-01 | Disposition: A | Discharge status: Home / Self Care | Payer: 59 | Source: Ambulatory Visit

## 2019-07-01 ENCOUNTER — Other Ambulatory Visit: Payer: Self-pay | Admitting: Physician Assistant

## 2019-07-01 NOTE — Progress Notes (Addendum)
Patient arrived to [facility] for pre-procedure COVID testing.  Swabber attempted to swab patient but patient would pull away from swabber.  Patient got irritated and refused to be swabbed. Patient stated that he will come back to be re-swabbed.    3:06 PM as of this time the patient has not returned to have his covid test completed.  Placed call to Dr. Marny Lowenstein OR Scheduler Sherrie to make her aware that the patient did not get his covid test completed today and that he will need to be rescheduled for his covid test. Had to leave message for Sherrie at office, left call back number.

## 2019-07-04 ENCOUNTER — Ambulatory Visit (HOSPITAL_BASED_OUTPATIENT_CLINIC_OR_DEPARTMENT_OTHER): Admission: RE | Admit: 2019-07-04 | Payer: 59 | Source: Home / Self Care | Admitting: Orthopaedic Surgery

## 2019-07-04 HISTORY — DX: Anxiety disorder, unspecified: F41.9

## 2019-07-04 HISTORY — DX: Impingement syndrome of left shoulder: M75.42

## 2019-07-04 HISTORY — DX: Unspecified osteoarthritis, unspecified site: M19.90

## 2019-07-04 HISTORY — DX: Depression, unspecified: F32.A

## 2019-07-04 SURGERY — SHOULDER ARTHROSCOPY WITH SUBACROMIAL DECOMPRESSION
Anesthesia: General | Site: Shoulder | Laterality: Left

## 2019-07-11 ENCOUNTER — Inpatient Hospital Stay: Payer: 59 | Admitting: Orthopaedic Surgery

## 2019-10-04 ENCOUNTER — Emergency Department (HOSPITAL_COMMUNITY): Payer: Self-pay

## 2019-10-04 ENCOUNTER — Other Ambulatory Visit: Payer: Self-pay

## 2019-10-04 ENCOUNTER — Encounter (HOSPITAL_COMMUNITY): Payer: Self-pay | Admitting: *Deleted

## 2019-10-04 ENCOUNTER — Emergency Department (HOSPITAL_COMMUNITY)
Admission: EM | Admit: 2019-10-04 | Discharge: 2019-10-04 | Disposition: A | Discharge status: Home / Self Care | Payer: Self-pay | Attending: Emergency Medicine | Admitting: Emergency Medicine

## 2019-10-04 DIAGNOSIS — Z79899 Other long term (current) drug therapy: Secondary | ICD-10-CM | POA: Insufficient documentation

## 2019-10-04 DIAGNOSIS — F17210 Nicotine dependence, cigarettes, uncomplicated: Secondary | ICD-10-CM | POA: Insufficient documentation

## 2019-10-04 DIAGNOSIS — M65311 Trigger thumb, right thumb: Secondary | ICD-10-CM | POA: Insufficient documentation

## 2019-10-04 DIAGNOSIS — M7918 Myalgia, other site: Secondary | ICD-10-CM | POA: Insufficient documentation

## 2019-10-04 MED ORDER — CELECOXIB 200 MG PO CAPS: 200.0000 mg | ORAL_CAPSULE | Freq: Two times a day (BID) | ORAL | 0 refills | Status: DC

## 2019-10-04 NOTE — Discharge Instructions (Signed)
Contact a health care provider if: Your symptoms are not improving with home care.

## 2019-10-04 NOTE — ED Triage Notes (Signed)
Reports 3-4 months ofd thumb locking up and pain
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
## 2019-10-04 NOTE — ED Notes (Signed)
Pt has splint placed.

## 2019-10-04 NOTE — Progress Notes (Signed)
Orthopedic Tech Progress Note Patient Details:  Tyler Clark 02/25/1961 [id number]  Ortho Devices Type of Ortho Device: Thumb spica splint Ortho Device/Splint Location: right Ortho Device/Splint Interventions: Application   Post Interventions Patient Tolerated: Well   [place] Pink 10/04/2019, 3:32 PM

## 2019-10-29 ENCOUNTER — Emergency Department (HOSPITAL_COMMUNITY)
Admission: EM | Admit: 2019-10-29 | Discharge: 2019-10-29 | Disposition: A | Discharge status: Home / Self Care | Payer: Self-pay | Attending: Emergency Medicine | Admitting: Emergency Medicine

## 2019-10-29 ENCOUNTER — Other Ambulatory Visit: Payer: Self-pay

## 2019-10-29 ENCOUNTER — Encounter (HOSPITAL_COMMUNITY): Payer: Self-pay

## 2019-10-29 DIAGNOSIS — R220 Localized swelling, mass and lump, head: Secondary | ICD-10-CM | POA: Insufficient documentation

## 2019-10-29 DIAGNOSIS — K0889 Other specified disorders of teeth and supporting structures: Secondary | ICD-10-CM | POA: Insufficient documentation

## 2019-10-29 DIAGNOSIS — Z5321 Procedure and treatment not carried out due to patient leaving prior to being seen by health care provider: Secondary | ICD-10-CM | POA: Insufficient documentation

## 2019-10-29 NOTE — ED Provider Notes (Signed)
Pt was not in room when I went to evaluate them. I was told by nursing staff he eloped from the ED.    Tyler Clark 10/29/19 1925    Lacretia Leigh, MD 11/25/19 [phone]

## 2019-10-29 NOTE — ED Triage Notes (Signed)
Patient c/o right upper dental pain (3 teeth) and facial swelling x 3 days. Patient denies any SOB or difficulty swallowing.

## 2020-12-01 ENCOUNTER — Emergency Department (HOSPITAL_COMMUNITY)
Admission: EM | Admit: 2020-12-01 | Discharge: 2020-12-01 | Disposition: A | Discharge status: Home / Self Care | Payer: 59 | Attending: Student | Admitting: Student

## 2020-12-01 ENCOUNTER — Other Ambulatory Visit: Payer: Self-pay

## 2020-12-01 ENCOUNTER — Emergency Department (HOSPITAL_COMMUNITY): Payer: 59

## 2020-12-01 ENCOUNTER — Encounter (HOSPITAL_COMMUNITY): Payer: Self-pay

## 2020-12-01 DIAGNOSIS — W228XXA Striking against or struck by other objects, initial encounter: Secondary | ICD-10-CM | POA: Diagnosis not present

## 2020-12-01 DIAGNOSIS — S62307A Unspecified fracture of fifth metacarpal bone, left hand, initial encounter for closed fracture: Secondary | ICD-10-CM

## 2020-12-01 DIAGNOSIS — F17210 Nicotine dependence, cigarettes, uncomplicated: Secondary | ICD-10-CM | POA: Insufficient documentation

## 2020-12-01 DIAGNOSIS — Y92009 Unspecified place in unspecified non-institutional (private) residence as the place of occurrence of the external cause: Secondary | ICD-10-CM | POA: Insufficient documentation

## 2020-12-01 DIAGNOSIS — M79642 Pain in left hand: Secondary | ICD-10-CM | POA: Diagnosis not present

## 2020-12-01 DIAGNOSIS — S6991XA Unspecified injury of right wrist, hand and finger(s), initial encounter: Secondary | ICD-10-CM | POA: Diagnosis present

## 2020-12-01 DIAGNOSIS — S62306A Unspecified fracture of fifth metacarpal bone, right hand, initial encounter for closed fracture: Secondary | ICD-10-CM | POA: Insufficient documentation

## 2020-12-01 MED ORDER — AMOXICILLIN-POT CLAVULANATE 875-125 MG PO TABS: 1.0000 | ORAL_TABLET | Freq: Two times a day (BID) | ORAL | 0 refills | Status: DC
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
## 2020-12-01 NOTE — Discharge Instructions (Addendum)
You have a fracture to the fifth metacarpal.  You have an ulnar gutter splint on, please keep it dry.  Please also call Dr. Biagio Borg clinic and establish a follow-up appointment for later this week.  Please call them today when you are discharged.  Make sure you take Augmentin twice daily for the next 7 days to prevent any infection from forming.  If you develop an infection, fevers, chills, significant pain to the hand please come back to the ED for further evaluation.

## 2023-02-05 ENCOUNTER — Emergency Department (HOSPITAL_COMMUNITY): Payer: Self-pay

## 2023-02-05 ENCOUNTER — Other Ambulatory Visit: Payer: Self-pay

## 2023-02-05 ENCOUNTER — Emergency Department (HOSPITAL_COMMUNITY)
Admission: EM | Admit: 2023-02-05 | Discharge: 2023-02-05 | Disposition: A | Discharge status: Against Medical Advice | Payer: Self-pay | Attending: Emergency Medicine | Admitting: Emergency Medicine

## 2023-02-05 ENCOUNTER — Encounter (HOSPITAL_COMMUNITY): Payer: Self-pay

## 2023-02-05 DIAGNOSIS — Z91199 Patient's noncompliance with other medical treatment and regimen due to unspecified reason: Secondary | ICD-10-CM

## 2023-02-05 DIAGNOSIS — Z7902 Long term (current) use of antithrombotics/antiplatelets: Secondary | ICD-10-CM | POA: Insufficient documentation

## 2023-02-05 DIAGNOSIS — R791 Abnormal coagulation profile: Secondary | ICD-10-CM | POA: Insufficient documentation

## 2023-02-05 DIAGNOSIS — I639 Cerebral infarction, unspecified: Secondary | ICD-10-CM | POA: Insufficient documentation

## 2023-02-05 DIAGNOSIS — I6381 Other cerebral infarction due to occlusion or stenosis of small artery: Secondary | ICD-10-CM

## 2023-02-05 DIAGNOSIS — Z5329 Procedure and treatment not carried out because of patient's decision for other reasons: Secondary | ICD-10-CM | POA: Insufficient documentation

## 2023-02-05 DIAGNOSIS — F17200 Nicotine dependence, unspecified, uncomplicated: Secondary | ICD-10-CM

## 2023-02-05 DIAGNOSIS — Z7982 Long term (current) use of aspirin: Secondary | ICD-10-CM | POA: Insufficient documentation

## 2023-02-05 LAB — CBC
HCT: 47.8 % (ref 39.0–52.0)
Hemoglobin: 16.2 g/dL (ref 13.0–17.0)
MCH: 30.6 pg (ref 26.0–34.0)
MCHC: 33.9 g/dL (ref 30.0–36.0)
MCV: 90.4 fL (ref 80.0–100.0)
Platelets: 222 10*3/uL (ref 150–400)
RBC: 5.29 MIL/uL (ref 4.22–5.81)
RDW: 12.9 % (ref 11.5–15.5)
WBC: 8.1 10*3/uL (ref 4.0–10.5)
nRBC: 0.2 % (ref 0.0–0.2)

## 2023-02-05 LAB — RAPID URINE DRUG SCREEN, HOSP PERFORMED
Amphetamines: NOT DETECTED
Barbiturates: NOT DETECTED
Benzodiazepines: NOT DETECTED
Cocaine: NOT DETECTED
Opiates: NOT DETECTED
Tetrahydrocannabinol: NOT DETECTED

## 2023-02-05 LAB — LIPID PANEL
Cholesterol: 171 mg/dL (ref 0–200)
HDL: 30 mg/dL — ABNORMAL LOW (ref >40)
LDL Cholesterol: UNDETERMINED mg/dL (ref 0–99)
Total CHOL/HDL Ratio: 5.7 {ratio}
Triglycerides: 423 mg/dL — ABNORMAL HIGH (ref <150)
VLDL: UNDETERMINED mg/dL (ref 0–40)

## 2023-02-05 LAB — I-STAT CHEM 8, ED
BUN: 15 mg/dL (ref 6–20)
Calcium, Ion: 1.1 mmol/L — ABNORMAL LOW (ref 1.15–1.40)
Chloride: 97 mmol/L — ABNORMAL LOW (ref 98–111)
Creatinine, Ser: 1 mg/dL (ref 0.61–1.24)
Glucose, Bld: 359 mg/dL — ABNORMAL HIGH (ref 70–99)
HCT: 49 % (ref 39.0–52.0)
Hemoglobin: 16.7 g/dL (ref 13.0–17.0)
Potassium: 3.9 mmol/L (ref 3.5–5.1)
Sodium: 135 mmol/L (ref 135–145)
TCO2: 27 mmol/L (ref 22–32)

## 2023-02-05 LAB — COMPREHENSIVE METABOLIC PANEL
ALT: 25 U/L (ref 0–44)
AST: 16 U/L (ref 15–41)
Albumin: 3.1 g/dL — ABNORMAL LOW (ref 3.5–5.0)
Alkaline Phosphatase: 34 U/L — ABNORMAL LOW (ref 38–126)
Anion gap: 11 (ref 5–15)
BUN: 12 mg/dL (ref 6–20)
CO2: 26 mmol/L (ref 22–32)
Calcium: 8.6 mg/dL — ABNORMAL LOW (ref 8.9–10.3)
Chloride: 98 mmol/L (ref 98–111)
Creatinine, Ser: 1.07 mg/dL (ref 0.61–1.24)
GFR, Estimated: >60 mL/min (ref >60)
Glucose, Bld: 361 mg/dL — ABNORMAL HIGH (ref 70–99)
Potassium: 3.7 mmol/L (ref 3.5–5.1)
Sodium: 135 mmol/L (ref 135–145)
Total Bilirubin: 0.5 mg/dL (ref 0.3–1.2)
Total Protein: 5.5 g/dL — ABNORMAL LOW (ref 6.5–8.1)

## 2023-02-05 LAB — DIFFERENTIAL
Abs Immature Granulocytes: 0.05 10*3/uL (ref 0.00–0.07)
Basophils Absolute: 0.1 10*3/uL (ref 0.0–0.1)
Basophils Relative: 1 %
Eosinophils Absolute: 0.2 10*3/uL (ref 0.0–0.5)
Eosinophils Relative: 2 %
Immature Granulocytes: 1 %
Lymphocytes Relative: 26 %
Lymphs Abs: 2.1 10*3/uL (ref 0.7–4.0)
Monocytes Absolute: 0.6 10*3/uL (ref 0.1–1.0)
Monocytes Relative: 7 %
Neutro Abs: 5.2 10*3/uL (ref 1.7–7.7)
Neutrophils Relative %: 63 %

## 2023-02-05 LAB — URINALYSIS, ROUTINE W REFLEX MICROSCOPIC
Bilirubin Urine: NEGATIVE
Glucose, UA: >=500 mg/dL — AB
Hgb urine dipstick: NEGATIVE
Ketones, ur: NEGATIVE mg/dL
Leukocytes,Ua: NEGATIVE
Nitrite: POSITIVE — AB
Protein, ur: NEGATIVE mg/dL
Specific Gravity, Urine: 1.04 — ABNORMAL HIGH (ref 1.005–1.030)
pH: 7 (ref 5.0–8.0)

## 2023-02-05 LAB — HEMOGLOBIN A1C
Hgb A1c MFr Bld: 10.7 % — ABNORMAL HIGH (ref 4.8–5.6)
Mean Plasma Glucose: 260.39 mg/dL

## 2023-02-05 LAB — PROTIME-INR
INR: 1 (ref 0.8–1.2)
Prothrombin Time: 13.6 s (ref 11.4–15.2)

## 2023-02-05 LAB — ETHANOL: Alcohol, Ethyl (B): <10 mg/dL (ref <10)

## 2023-02-05 LAB — APTT: aPTT: 23 s — ABNORMAL LOW (ref 24–36)

## 2023-02-05 MED ORDER — CLOPIDOGREL BISULFATE 75 MG PO TABS: 75.0000 mg | ORAL_TABLET | Freq: Every day | ORAL | 0 refills | Status: AC

## 2023-02-05 MED ORDER — IOHEXOL 350 MG/ML SOLN
75.0000 mL | Freq: Once | INTRAVENOUS | Status: AC | PRN
  Administered 2023-02-05: 75 mL via INTRAVENOUS

## 2023-02-05 MED ORDER — ASPIRIN 81 MG PO CHEW: 81.0000 mg | CHEWABLE_TABLET | Freq: Every day | ORAL | 1 refills | Status: AC

## 2023-02-05 MED ORDER — LORAZEPAM 1 MG PO TABS: 0.5000 mg | ORAL_TABLET | Freq: Once | ORAL | Status: DC

## 2023-02-05 MED ORDER — CLOPIDOGREL BISULFATE 75 MG PO TABS
75.0000 mg | ORAL_TABLET | Freq: Every day | ORAL | Status: DC
  Administered 2023-02-05: 75 mg via ORAL
  Filled 2023-02-05: qty 1

## 2023-02-05 MED ORDER — ASPIRIN 81 MG PO CHEW
81.0000 mg | CHEWABLE_TABLET | Freq: Every day | ORAL | Status: DC
  Administered 2023-02-05: 81 mg via ORAL
  Filled 2023-02-05: qty 1

## 2023-02-05 NOTE — ED Notes (Signed)
Patient transported to CT 

## 2023-02-05 NOTE — ED Notes (Signed)
Patient returned from MRI.

## 2023-02-05 NOTE — ED Provider Notes (Signed)
[place] [facility]GENC[facility] DEPARTMENT AT [facility] Provider Note   CSN: [id number] Arrival date & time: 02/05/23  1407     History  No chief complaint on file.   Tyler Clark is a 62 y.o. male.  HPI     62 year old male comes in with chief complaint of headache  Home Medications Prior to Admission medications   Medication Sig Start Date End Date Taking? Authorizing Provider  aspirin 81 MG chewable tablet Chew 1 tablet (81 mg total) by mouth daily. 02/05/23  Yes Derwood Kaplan, MD  clopidogrel (PLAVIX) 75 MG tablet Take 1 tablet (75 mg total) by mouth daily. 02/05/23  Yes Derwood Kaplan, MD  DM-Doxylamine-Acetaminophen (NYQUIL COLD & FLU PO) Take 30 mLs by mouth every 6 (six) hours as needed (cough, cold symptoms).   Yes [provider]      Allergies    Coconut (cocos nucifera)    Review of Systems   Review of Systems  Physical Exam Updated Vital Signs BP 130/73   Pulse 67   Temp 98.6 F (37 C) (Oral)   Resp 18   SpO2 94%  Physical Exam  ED Results / Procedures / Treatments   Labs (all labs ordered are listed, but only abnormal results are displayed) Labs Reviewed  APTT - Abnormal; Notable for the following components:      Result Value   aPTT 23 (*)    All other components within normal limits  COMPREHENSIVE METABOLIC PANEL - Abnormal; Notable for the following components:   Glucose, Bld 361 (*)    Calcium 8.6 (*)    Total Protein 5.5 (*)    Albumin 3.1 (*)    Alkaline Phosphatase 34 (*)    All other components within normal limits  URINALYSIS, ROUTINE W REFLEX MICROSCOPIC - Abnormal; Notable for the following components:   APPearance HAZY (*)    Specific Gravity, Urine 1.040 (*)    Glucose, UA >=500 (*)    Nitrite POSITIVE (*)    Bacteria, UA MANY (*)    All other components within normal limits  I-STAT CHEM 8, ED - Abnormal; Notable for the following components:   Chloride 97 (*)    Glucose, Bld 359 (*)    Calcium, Ion 1.10  (*)    All other components within normal limits  ETHANOL  PROTIME-INR  CBC  DIFFERENTIAL  RAPID URINE DRUG SCREEN, HOSP PERFORMED  LIPID PANEL  HEMOGLOBIN A1C    EKG EKG Interpretation Date/Time:  Sunday February 05 2023 14:23:57 EDT Ventricular Rate:  89 PR Interval:  142 QRS Duration:  80 QT Interval:  358 QTC Calculation: 435 R Axis:   28  Text Interpretation: Normal sinus rhythm Cannot rule out Anterior infarct , age undetermined T wave abnormality, consider inferior ischemia Abnormal ECG When compared with ECG of 30-Nov-2009 12:49, PREVIOUS ECG IS PRESENT new inferior TWI Confirmed by Derwood Kaplan [phone]) on 02/05/2023 4:03:25 PM  Radiology MR BRAIN WO CONTRAST  Result Date: 02/05/2023 CLINICAL DATA:  Neuro deficit, acute, stroke suspected EXAM: MRI HEAD WITHOUT CONTRAST TECHNIQUE: Multiplanar, multiecho pulse sequences of the brain and surrounding structures were obtained without intravenous contrast. COMPARISON:  CTA head/neck from today. FINDINGS: Brain: Probable punctate acute infarct in the posterior left paramidline brainstem at the junction of the pons and midbrain (series 5, image 72). No substantial edema or mass effect. No evidence of acute hemorrhage, midline shift or hydrocephalus. Mild for age scattered T2/FLAIR hyperintensities in the white matter are compatible with  chronic microvascular ischemic disease. Vascular: Major arterial flow voids are maintained at the skull base. Skull and upper cervical spine: Normal marrow signal. Sinuses/Orbits: Mostly clear sinuses. Mild paranasal sinus mucosal thickening. No acute orbital findings. IMPRESSION: Probable punctate acute infarct in the posterior left paramidline brainstem at the junction of the pons and midbrain. No evidence of acute hemorrhage, mass lesion, midline shift or hydrocephalus. Findings discussed with Dr. Ezzie Dural via telephone at 7:25 p.m. Electronically Signed   By: Feliberto Harts M.D.   On: 02/05/2023 19:32    CT ANGIO HEAD NECK W WO CM  Result Date: 02/05/2023 CLINICAL DATA:  Neuro deficit, acute, stroke suspected. Last known well at 10 p.m. last night. Intermittent diplopia. Right facial droop increasing instability. Right lower extremity weakness. Visual distortions. EXAM: CT ANGIOGRAPHY HEAD AND NECK WITH AND WITHOUT CONTRAST TECHNIQUE: Multidetector CT imaging of the head and neck was performed using the standard protocol during bolus administration of intravenous contrast. Multiplanar CT image reconstructions and MIPs were obtained to evaluate the vascular anatomy. Carotid stenosis measurements (when applicable) are obtained utilizing NASCET criteria, using the distal internal carotid diameter as the denominator. RADIATION DOSE REDUCTION: This exam was performed according to the departmental dose-optimization program which includes automated exposure control, adjustment of the mA and/or kV according to patient size and/or use of iterative reconstruction technique. CONTRAST:  75mL OMNIPAQUE IOHEXOL 350 MG/ML SOLN COMPARISON:  MR head without contrast 05/03/2017. FINDINGS: CT HEAD FINDINGS Brain: No acute infarct, hemorrhage, or mass lesion is present. No significant white matter lesions are present. Deep brain nuclei are within normal limits. The ventricles are of normal size. No significant extraaxial fluid collection is present. The brainstem and cerebellum are within normal limits. Midline structures are within normal limits. Vascular: No hyperdense vessel or unexpected calcification. Skull: Calvarium is intact. No focal lytic or blastic lesions are present. No significant extracranial soft tissue lesion is present. Sinuses/Orbits: The paranasal sinuses and mastoid air cells are clear. The globes and orbits are within normal limits. Review of the MIP images confirms the above findings CTA NECK FINDINGS Aortic arch: A 3 vessel arch configuration is present. Atherosclerotic changes are present in the distal  aortic arch and at the origin of the left subclavian artery without significant stenosis or aneurysm. No dissection is present. Right carotid system: Right common carotid artery is within normal limits. Atherosclerotic calcifications are present bifurcation without significant stenosis. The cervical right ICA is otherwise normal. Left carotid system: Mild irregularity is present in the left common carotid artery without significant stenosis. Minimal calcifications present bifurcation. The cervical left ICA is otherwise normal. Vertebral arteries: Left vertebral artery is the dominant vessel. Both vertebral arteries originate from the subclavian arteries without significant stenosis. No significant stenosis is present in either vertebral artery in the neck. Skeleton: Mild degenerative changes are present in the cervical spine. No focal osseous lesions are present. Extensive dental disease is present. Dental caries are present within the residual teeth. Other neck: Soft tissues the neck are otherwise unremarkable. Salivary glands are within normal limits. Thyroid is normal. No significant adenopathy is present. No focal mucosal or submucosal lesions are present. Upper chest: Paraseptal emphysematous changes are present in both lungs. No nodule or mass lesion is present. No significant centrilobular emphysema is present. The thoracic inlet is within normal limits. Review of the MIP images confirms the above findings CTA HEAD FINDINGS Anterior circulation: Atherosclerotic calcifications are present within the cavernous left internal carotid artery. No significant stenosis is present. No  significant stenosis is present in either internal carotid artery from the skull base through the ICA termini. The A1 and M1 segments are normal. The anterior communicating artery is patent. The MCA bifurcations are within normal limits bilaterally. The ACA and MCA branch vessels are normal. Posterior circulation: The left PICA origin is  visualized and normal. The vertebrobasilar junction and basilar artery is normal. The right AICA is dominant. The superior cerebellar arteries are patent. The right posterior cerebral artery originates from basilar tip. Left posterior cerebral artery is of fetal type. The PCA branch vessels are normal bilaterally. Venous sinuses: The dural sinuses are patent. The straight sinus and deep cerebral veins are intact. Cortical veins are within normal limits. No significant vascular malformation is evident. Anatomic variants: None Review of the MIP images confirms the above findings IMPRESSION: 1. Atherosclerotic changes at the carotid bifurcations bilaterally and cavernous left internal carotid artery without significant stenosis. 2. No significant proximal stenosis, aneurysm, or branch vessel occlusion within the Circle of Willis. 3. Normal noncontrast CT of the head. 4. Extensive dental disease. 5. Aortic Atherosclerosis (ICD10-I70.0) and Emphysema (ICD10-J43.9). These results were called by telephone at the time of interpretation on 02/05/2023 at 6:05 pm to provider Dr. Derwood Kaplan, Who verbally acknowledged these results. Electronically Signed   By: Marin Roberts M.D.   On: 02/05/2023 18:09    Procedures Procedures    Medications Ordered in ED Medications  LORazepam (ATIVAN) tablet 0.5 mg (0 mg Oral Hold 02/05/23 1608)  aspirin chewable tablet 81 mg (81 mg Oral Given 02/05/23 1957)  clopidogrel (PLAVIX) tablet 75 mg (75 mg Oral Given 02/05/23 1957)  iohexol (OMNIPAQUE) 350 MG/ML injection 75 mL (75 mLs Intravenous Contrast Given 02/05/23 1608)    ED Course/ Medical Decision Making/ A&P                                 Medical Decision Making Amount and/or Complexity of Data Reviewed Radiology: ordered.  Risk OTC drugs. Prescription drug management.   This patient presents to the ED with chief complaint(s) of visual disturbance, dizziness and possibly right-sided weakness with  pertinent past medical history of tobacco use disorder.patient has no medical history, but does not see a physician.The complaint involves an extensive differential diagnosis and also carries with it a high risk of complications and morbidity.    The differential diagnosis includes : Stroke - ischemic vs. Hemorrhagic versus embolic TIA Neuropathy Demyelinating disease process Electrolyte abnormality Orthostatic dizziness Retinal detachment CRAO CRVO  Patient's Zenaida Niece screen is negative.  Last known normal 6 PM yesterday.  Not a code stroke.  The initial plan is to initiate stroke workup.   Additional history obtained: Additional history obtained from spouse  Independent labs interpretation:  The following labs were independently interpreted: Initial labs are overall reassuring besides elevated blood sugar.  Patient states that he just had [place] before coming in.  Independent visualization and interpretation of imaging: - I independently visualized the following imaging with scope of interpretation limited to determining acute life threatening conditions related to emergency care: CT angio head and neck, which revealed no evidence of brain bleed  Treatment and Reassessment: Patient is MRI confirms stroke.  Results discussed with the patient.  He had already made it clear to me that he would not want to stay in the hospital earlier, but despite the diagnosis he has not changed his mind.  Patient wants to leave against medical  advice. Patient understands that his/her actions will lead to inadequate medical workup, and that he/she is at risk of complications of missed diagnosis, which includes morbidity and mortality.  Alternative options discussed -come in for observation type stay.  We also advised that we can give him medications for anxiety and also nicotine patch. Opportunity to change mind given  Discussion witnessed by patient's spouse Patient is demonstrating good capacity to  make decision. Patient understands that he needs to return to the ER immediately if his symptoms get worse.   Consultation: - Consulted or discussed management/test interpretation with external professional: Neurology team.  They have promptly seen the patient and recommended that we start him on aspirin indefinitely and Plavix for the next 21 days.  They also recommended admission, but patient has declined.  They ordered some additional test that can be followed up by outpatient neurology team.  Outpatient neurology referral with stroke team has been placed.   Final Clinical Impression(s) / ED Diagnoses Final diagnoses:  Acute ischemic stroke [facility])    Rx / DC Orders ED Discharge Orders          Ordered    Ambulatory referral to Neurology       Comments: An appointment is requested in approximately: 1 week   02/05/23 2001    aspirin 81 MG chewable tablet  Daily        02/05/23 2002    clopidogrel (PLAVIX) 75 MG tablet  Daily        02/05/23 2002              Macallister Ashmead, MD 02/05/23 2006

## 2023-02-05 NOTE — Discharge Instructions (Addendum)
You are seen in the emergency room for vision disturbance.  MRI confirms that you had a stroke.  We want you to be admitted to the hospital for additional workup, you have decided to leave AGAINST MEDICAL ADVICE.  Please start taking the medications that are prescribed. Follow-up with the neurologist for additional workup to focus on stroke prevention.  Try to cut back and stop your tobacco use. Return to the ER immediately if you start having worsening one-sided weakness, numbness, slurred speech, balance issues, vision issues.

## 2023-02-05 NOTE — ED Provider Triage Note (Signed)
Emergency Medicine Provider Triage Evaluation Note  Tyler Clark , a 62 y.o. male  was evaluated in triage.  Pt complains of intermittent double vision starting at 10PM last night. , and dizziness. R lower face droop last night and increased instability. Reports RLE weakness. +smoker  Review of Systems  Positive: weakness Negative: Chest pain  Physical Exam  There were no vitals taken for this visit. Gen:   Awake, no distress   Resp:  Normal effort  Other:  R sided facial droop, increased weakness to RLE. Difficulty with finger to nose  Medical Decision Making  Medically screening exam initiated at 2:11 PM.  Appropriate orders placed.  Glo Herring was informed that the remainder of the evaluation will be completed by another provider, this initial triage assessment does not replace that evaluation, and the importance of remaining in the ED until their evaluation is complete.  Called Dr. Rhunette Croft, he will eval pt and decide if to call code stroke or not.   Pete Pelt, [place] 02/05/23 1422

## 2023-02-05 NOTE — ED Notes (Signed)
Pt ambulated without assistance

## 2023-02-05 NOTE — ED Notes (Signed)
Pt aware of need for urine sample.  

## 2023-02-05 NOTE — ED Notes (Signed)
Patient transported to MRI 

## 2023-02-05 NOTE — Consult Note (Signed)
NEUROLOGY CONSULT NOTE   Date of service: February 05, 2023 Patient Name: Tyler Clark MRN:  [id number] DOB:  December 16, 1960 Chief Complaint: "diplopia, veering to hid right" Requesting Provider: Derwood Kaplan, MD  History of Present Illness  CULLEN STEFF is a 62 y.o. male with hx of depression, anxiety, smoker, psoriasis who presents with diplopia with R gaze along with verring to his right when walking. Was watching TV on 02/04/23 between 10pm and midnight when this started.  Endorses prior "mini strokes", mother died from a large stroke. He does not see a doctor regularly. He was surprised that his glucose was in 300s today. He does not know if he has high blood pressure or high cholesterol.  He has cut down smoking from 2ppd to 1ppd. He says he will cut down when he feels ready and unless he wants to, nobody can help him out.  He is not willing to stay for stroke workup but is willing to follow up.  LKW: 02/04/23 at 2200PM Modified rankin score: 0-Completely asymptomatic and back to baseline post- stroke IV Thrombolysis: not offered, outside window EVT: not offered, no LVO  NIHSS components Score: Comment  1a Level of Conscious 0[x]  1[]  2[]  3[]      1b LOC Questions 0[x]  1[]  2[]       1c LOC Commands 0[x]  1[]  2[]       2 Best Gaze 0[x]  1[]  2[]       3 Visual 0[x]  1[]  2[]  3[]      4 Facial Palsy 0[x]  1[]  2[]  3[]      5a Motor Arm - left 0[x]  1[]  2[]  3[]  4[]  UN[]    5b Motor Arm - Right 0[x]  1[]  2[]  3[]  4[]  UN[]    6a Motor Leg - Left 0[x]  1[]  2[]  3[]  4[]  UN[]    6b Motor Leg - Right 0[x]  1[]  2[]  3[]  4[]  UN[]    7 Limb Ataxia 0[x]  1[]  2[]  3[]  UN[]     8 Sensory 0[x]  1[]  2[]  UN[]      9 Best Language 0[x]  1[]  2[]  3[]      10 Dysarthria 0[x]  1[]  2[]  UN[]      11 Extinct. and Inattention 0[x]  1[]  2[]       TOTAL: 0      ROS  Comprehensive ROS performed and pertinent positives documented in HPI  Past History   Past Medical History:  Diagnosis Date   Abdominal pain    Anxiety     Arthritis    Chest pain    Colon polyps 08/20/2012   colonoscopy for bloody stools.   Depression    Impingement syndrome of left shoulder    Psoriasis    Umbilical hernia     Past Surgical History:  Procedure Laterality Date   CHOLECYSTECTOMY     COLONOSCOPY W/ POLYPECTOMY  08/16/2012   +colon polyps; repeat in 5 years.   KNEE SURGERY     OPEN ANTERIOR SHOULDER RECONSTRUCTION     ORTHOPEDIC SURGERY      Family History: Family History  Problem Relation Age of Onset   Diabetes Father    Colon cancer Neg Hx    Rectal cancer Neg Hx    Stomach cancer Neg Hx     Social History  reports that he has been smoking cigarettes. He has never used smokeless tobacco. He reports current alcohol use of about 2.0 standard drinks of alcohol per week. He reports that he does not use drugs.  Allergies  Allergen Reactions   Coconut (Cocos Nucifera) Anaphylaxis and Hives  Medications   Current Facility-Administered Medications:    aspirin chewable tablet 81 mg, 81 mg, Oral, Daily, [name] Blinks, MD, 81 mg at 02/05/23 1957   clopidogrel (PLAVIX) tablet 75 mg, 75 mg, Oral, Daily, [name] Blinks, MD, 75 mg at 02/05/23 1957   LORazepam (ATIVAN) tablet 0.5 mg, 0.5 mg, Oral, Once, Derwood Kaplan, MD  Current Outpatient Medications:    aspirin 81 MG chewable tablet, Chew 1 tablet (81 mg total) by mouth daily., Disp: 90 tablet, Rfl: 1   clopidogrel (PLAVIX) 75 MG tablet, Take 1 tablet (75 mg total) by mouth daily., Disp: 21 tablet, Rfl: 0   DM-Doxylamine-Acetaminophen (NYQUIL COLD & FLU PO), Take 30 mLs by mouth every 6 (six) hours as needed (cough, cold symptoms)., Disp: , Rfl:   Vitals   Vitals:   02/05/23 1530 02/05/23 1800 02/05/23 1805 02/05/23 1915  BP: (!) 146/81 134/74  130/73  Pulse: 78 66  67  Resp: 20 18    Temp:   98.6 F (37 C)   TempSrc:   Oral   SpO2: 95% 94%  94%    There is no height or weight on file to calculate BMI.  Physical Exam   Constitutional:  Appears well-developed and well-nourished.  Psych: Affect appropriate to situation.  Eyes: No scleral injection.  HENT: No OP obstruction.  Head: Normocephalic.  Cardiovascular: Normal rate and regular rhythm.  Respiratory: Effort normal, non-labored breathing.  GI: Soft.  No distension. There is no tenderness.  Skin: WDI.    Neurologic Examination  Mental status/Cognition: Alert, oriented to self, place, month and year, good attention.  Speech/language: Fluent, comprehension intact, object naming intact, repetition intact.  Cranial nerves:   CN II Pupils equal and reactive to light, no VF deficits    CN III,IV,VI EOM intact, no gaze preference or deviation, no nystagmus, diplopia to right gaze.   CN V normal sensation in V1, V2, and V3 segments bilaterally    CN VII no asymmetry, no nasolabial fold flattening    CN VIII normal hearing to speech    CN IX & X normal palatal elevation, no uvular deviation    CN XI 5/5 head turn and 5/5 shoulder shrug bilaterally    CN XII midline tongue protrusion    Motor:  Muscle bulk: normal, tone normal, pronator drift none tremor none Mvmt Root Nerve  Muscle Right Left Comments  SA C5/6 Ax Deltoid 5 5   EF C5/6 Mc Biceps  5   EE C6/7/8 Rad Triceps  5 Did not want to move his R arm due to IV in the antecubital fossa.  WF C6/7 Med FCR     WE C7/8 PIN ECU     F Ab C8/T1 U ADM/FDI 5 5   HF L1/2/3 Fem Illopsoas 5 5   KE L2/3/4 Fem Quad 5 5   DF L4/5 D Peron Tib Ant 5 5   PF S1/2 Tibial Grc/Sol 5 5    Sensation:  Light touch Intact throughout   Pin prick    Temperature    Vibration   Proprioception    Coordination/Complex Motor:  - Finger to Nose intact on the left. Did not want to bend R elbow due to IV in the antecubital fossa. - Heel to shin intact BL - Rapid alternating movement are intact B: - Gait: deferred.   Labs   CBC:  Recent Labs  Lab 02/05/23 1426 02/05/23 1446  WBC 8.1  --   NEUTROABS 5.2  --  HGB 16.2 16.7   HCT 47.8 49.0  MCV 90.4  --   PLT 222  --     Basic Metabolic Panel:  Lab Results  Component Value Date   NA 135 02/05/2023   K 3.9 02/05/2023   CO2 26 02/05/2023   GLUCOSE 359 (H) 02/05/2023   BUN 15 02/05/2023   CREATININE 1.00 02/05/2023   CALCIUM 8.6 (L) 02/05/2023   GFRNONAA >60 02/05/2023   GFRAA >60 12/20/2016   Lipid Panel: No results found for: "LDLCALC" HgbA1c: No results found for: "HGBA1C" Urine Drug Screen:     Component Value Date/Time   LABOPIA NONE DETECTED 02/05/2023 1742   COCAINSCRNUR NONE DETECTED 02/05/2023 1742   LABBENZ NONE DETECTED 02/05/2023 1742   AMPHETMU NONE DETECTED 02/05/2023 1742   THCU NONE DETECTED 02/05/2023 1742   LABBARB NONE DETECTED 02/05/2023 1742    Alcohol Level     Component Value Date/Time   ETH <10 02/05/2023 1426   INR  Lab Results  Component Value Date   INR 1.0 02/05/2023   APTT  Lab Results  Component Value Date   APTT 23 (L) 02/05/2023   AED levels: No results found for: "PHENYTOIN", "ZONISAMIDE", "LAMOTRIGINE", "LEVETIRACETA"   CT Head without contrast(Personally reviewed): CTH was negative for a large hypodensity concerning for a large territory infarct or hyperdensity concerning for an ICH  CT angio Head and Neck with contrast(Personally reviewed): No LVO  MRI Brain(Personally reviewed): Small punctate stroke in the left paramedian pontomedullary junction.  Impression   LEIGH ANASTACIO is a 62 y.o. male, smoker, does not see a doctor regularly who presents with diplopia and veering off to his right. He is outside tnkase window and has no LVO and therefore not a candidate for thrombectomy. MRI Brain demonstrates a small punctate left paramedian pontomedullary junction stroke.  Etiology is likely small vessel. He does not want to stay overnight in the hospital for stroke workup.  Recommendations  - Frequent Neuro checks per stroke unit protocol - Recommend obtaining TTE. Since he does not want to  stay inhospital overnight, this will need to be done outpatient. - Recommend obtaining Lipid panel with LDL - Please start statin if LDL > 70 - Recommend HbA1c to evaluate for diabetes and how well it is controlled. - Antithrombotic - aspirin 81mg  daily along with plavix 75mg  daily x 21 days, followed by Aspirin 81mg  daily alone. - Recommend DVT ppx - SBP goal - permissive hypertension first 24 h < 220/110. Held home meds.  - Recommend Telemetry monitoring for arrythmia - Recommend bedside swallow screen prior to PO intake. - Stroke education booklet - Recommend PT/OT/SLP consult - counseled him on the importance of quitting smoking to reduce risk of stroke in the future. - counseled him on the importance of regular follow up with a PCP.  ______________________________________________________________________    Welton Flakes [facility]

## 2023-02-05 NOTE — ED Triage Notes (Signed)
Patient complains of dizziness, double vision, right leg weakness since 10pm last night. Patient alert and oriented, denies pain. No extremity weakness upper or lower, facial droop noted, speech clear

## 2023-02-06 LAB — LDL CHOLESTEROL, DIRECT: Direct LDL: 93 mg/dL (ref 0–99)

## 2023-11-09 ENCOUNTER — Other Ambulatory Visit: Payer: Self-pay

## 2023-11-09 ENCOUNTER — Encounter (HOSPITAL_COMMUNITY): Payer: Self-pay

## 2023-11-09 ENCOUNTER — Emergency Department (HOSPITAL_COMMUNITY)
Admission: EM | Admit: 2023-11-09 | Discharge: 2023-11-10 | Disposition: A | Discharge status: Home / Self Care | Payer: Self-pay | Attending: Emergency Medicine | Admitting: Emergency Medicine

## 2023-11-09 ENCOUNTER — Emergency Department (HOSPITAL_COMMUNITY): Payer: Self-pay

## 2023-11-09 DIAGNOSIS — F10120 Alcohol abuse with intoxication, uncomplicated: Secondary | ICD-10-CM | POA: Diagnosis not present

## 2023-11-09 DIAGNOSIS — Z8673 Personal history of transient ischemic attack (TIA), and cerebral infarction without residual deficits: Secondary | ICD-10-CM | POA: Diagnosis not present

## 2023-11-09 DIAGNOSIS — Y92410 Unspecified street and highway as the place of occurrence of the external cause: Secondary | ICD-10-CM | POA: Insufficient documentation

## 2023-11-09 DIAGNOSIS — F10920 Alcohol use, unspecified with intoxication, uncomplicated: Secondary | ICD-10-CM

## 2023-11-09 DIAGNOSIS — I700 Atherosclerosis of aorta: Secondary | ICD-10-CM | POA: Diagnosis not present

## 2023-11-09 DIAGNOSIS — Z7901 Long term (current) use of anticoagulants: Secondary | ICD-10-CM | POA: Diagnosis not present

## 2023-11-09 DIAGNOSIS — S0001XA Abrasion of scalp, initial encounter: Secondary | ICD-10-CM

## 2023-11-09 DIAGNOSIS — S060X1A Concussion with loss of consciousness of 30 minutes or less, initial encounter: Secondary | ICD-10-CM | POA: Insufficient documentation

## 2023-11-09 DIAGNOSIS — Z7982 Long term (current) use of aspirin: Secondary | ICD-10-CM | POA: Diagnosis not present

## 2023-11-09 DIAGNOSIS — S0990XA Unspecified injury of head, initial encounter: Secondary | ICD-10-CM | POA: Diagnosis present

## 2023-11-09 LAB — I-STAT CHEM 8, ED
BUN: 10 mg/dL (ref 8–23)
Calcium, Ion: 1.06 mmol/L — ABNORMAL LOW (ref 1.15–1.40)
Chloride: 100 mmol/L (ref 98–111)
Creatinine, Ser: 1 mg/dL (ref 0.61–1.24)
Glucose, Bld: 222 mg/dL — ABNORMAL HIGH (ref 70–99)
HCT: 49 % (ref 39.0–52.0)
Hemoglobin: 16.7 g/dL (ref 13.0–17.0)
Potassium: 3.8 mmol/L (ref 3.5–5.1)
Sodium: 135 mmol/L (ref 135–145)
TCO2: 22 mmol/L (ref 22–32)

## 2023-11-09 MED ORDER — MIDAZOLAM HCL 2 MG/2ML IJ SOLN: 2.0000 mg | Freq: Once | INTRAMUSCULAR | Status: AC

## 2023-11-09 MED ORDER — HALOPERIDOL LACTATE 5 MG/ML IJ SOLN: 5.0000 mg | Freq: Once | INTRAMUSCULAR | Status: AC
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
## 2023-11-10 ENCOUNTER — Emergency Department (HOSPITAL_COMMUNITY): Payer: Self-pay

## 2023-11-10 LAB — CBC WITH DIFFERENTIAL/PLATELET
Abs Immature Granulocytes: 0.08 K/uL — ABNORMAL HIGH (ref 0.00–0.07)
Basophils Absolute: 0.1 K/uL (ref 0.0–0.1)
Basophils Relative: 1 %
Eosinophils Absolute: 0.2 K/uL (ref 0.0–0.5)
Eosinophils Relative: 2 %
HCT: 48.9 % (ref 39.0–52.0)
Hemoglobin: 16.5 g/dL (ref 13.0–17.0)
Immature Granulocytes: 1 %
Lymphocytes Relative: 18 %
Lymphs Abs: 1.9 K/uL (ref 0.7–4.0)
MCH: 30.6 pg (ref 26.0–34.0)
MCHC: 33.7 g/dL (ref 30.0–36.0)
MCV: 90.6 fL (ref 80.0–100.0)
Monocytes Absolute: 0.7 K/uL (ref 0.1–1.0)
Monocytes Relative: 7 %
Neutro Abs: 7.5 K/uL (ref 1.7–7.7)
Neutrophils Relative %: 71 %
Platelets: 269 K/uL (ref 150–400)
RBC: 5.4 MIL/uL (ref 4.22–5.81)
RDW: 13.1 % (ref 11.5–15.5)
WBC: 10.5 K/uL (ref 4.0–10.5)
nRBC: 0 % (ref 0.0–0.2)

## 2023-11-10 LAB — RAPID URINE DRUG SCREEN, HOSP PERFORMED
Amphetamines: NOT DETECTED
Barbiturates: NOT DETECTED
Benzodiazepines: NOT DETECTED
Cocaine: NOT DETECTED
Opiates: NOT DETECTED
Tetrahydrocannabinol: NOT DETECTED

## 2023-11-10 LAB — COMPREHENSIVE METABOLIC PANEL WITH GFR
ALT: 35 U/L (ref 0–44)
AST: 44 U/L — ABNORMAL HIGH (ref 15–41)
Albumin: 3.9 g/dL (ref 3.5–5.0)
Alkaline Phosphatase: 45 U/L (ref 38–126)
Anion gap: 13 (ref 5–15)
BUN: 11 mg/dL (ref 8–23)
CO2: 24 mmol/L (ref 22–32)
Calcium: 9 mg/dL (ref 8.9–10.3)
Chloride: 99 mmol/L (ref 98–111)
Creatinine, Ser: 0.86 mg/dL (ref 0.61–1.24)
GFR, Estimated: >60 mL/min (ref >60)
Glucose, Bld: 226 mg/dL — ABNORMAL HIGH (ref 70–99)
Potassium: 3.6 mmol/L (ref 3.5–5.1)
Sodium: 136 mmol/L (ref 135–145)
Total Bilirubin: 0.9 mg/dL (ref 0.0–1.2)
Total Protein: 7.5 g/dL (ref 6.5–8.1)

## 2023-11-10 LAB — PROTIME-INR
INR: 1 (ref 0.8–1.2)
Prothrombin Time: 13.3 s (ref 11.4–15.2)

## 2023-11-10 LAB — URINALYSIS, ROUTINE W REFLEX MICROSCOPIC
Bilirubin Urine: NEGATIVE
Glucose, UA: >=500 mg/dL — AB
Hgb urine dipstick: NEGATIVE
Ketones, ur: NEGATIVE mg/dL
Leukocytes,Ua: NEGATIVE
Nitrite: POSITIVE — AB
Protein, ur: NEGATIVE mg/dL
Specific Gravity, Urine: 1 — ABNORMAL LOW (ref 1.005–1.030)
pH: 6 (ref 5.0–8.0)

## 2023-11-10 LAB — BLOOD GAS, VENOUS
Acid-base deficit: 10.3 mmol/L — ABNORMAL HIGH (ref 0.0–2.0)
Bicarbonate: 16.2 mmol/L — ABNORMAL LOW (ref 20.0–28.0)
O2 Saturation: 90.3 %
Patient temperature: 37
pCO2, Ven: 37 mmHg — ABNORMAL LOW (ref 44–60)
pH, Ven: 7.25 (ref 7.25–7.43)
pO2, Ven: 63 mmHg — ABNORMAL HIGH (ref 32–45)

## 2023-11-10 LAB — ETHANOL: Alcohol, Ethyl (B): 191 mg/dL — ABNORMAL HIGH (ref <15)

## 2023-11-10 MED ORDER — MIDAZOLAM HCL 2 MG/2ML IJ SOLN
INTRAMUSCULAR | Status: AC
  Administered 2023-11-10: 2 mg via INTRAMUSCULAR
  Filled 2023-11-10: qty 2

## 2023-11-10 MED ORDER — IOHEXOL 300 MG/ML  SOLN
100.0000 mL | Freq: Once | INTRAMUSCULAR | Status: AC | PRN
  Administered 2023-11-10: 100 mL via INTRAVENOUS

## 2023-11-10 MED ORDER — ACETAMINOPHEN 325 MG PO TABS
650.0000 mg | ORAL_TABLET | Freq: Once | ORAL | Status: AC
  Administered 2023-11-10: 650 mg via ORAL
  Filled 2023-11-10: qty 2

## 2023-11-10 MED ORDER — HALOPERIDOL LACTATE 5 MG/ML IJ SOLN
INTRAMUSCULAR | Status: AC
  Administered 2023-11-10: 5 mg via INTRAMUSCULAR
  Filled 2023-11-10: qty 1

## 2023-11-10 MED ORDER — ALBUTEROL SULFATE HFA 108 (90 BASE) MCG/ACT IN AERS
2.0000 | INHALATION_SPRAY | Freq: Once | RESPIRATORY_TRACT | Status: AC
  Administered 2023-11-10: 2 via RESPIRATORY_TRACT
  Filled 2023-11-10: qty 6.7
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
## 2023-11-10 NOTE — ED Notes (Addendum)
 Ambulated pt in hallway and back to room. Pt a little unsteady on feet. Pt and pt's spouse stated this was normal due to having bad hips.

## 2023-11-10 NOTE — ED Notes (Signed)
 C-collar applied to pt.

## 2023-11-10 NOTE — Discharge Instructions (Addendum)
 You had a CT scan performed in the emergency department today that showed multiple incidental findings that will need to be followed up with your family doctor.  Of note you have atherosclerosis of your aorta, emphysema, and enlarged liver.  It is in your best interest to stop smoking and avoid alcohol.

## 2023-11-11 LAB — URINE CULTURE: Culture: NO GROWTH
